# Patient Record
Sex: Female | Born: 1975 | Race: White | Hispanic: No | Marital: Married | Smoking: Current every day smoker
Health system: Southern US, Community
[De-identification: ages and names within clinical notes are randomized; demographics above are authoritative.]

## PROBLEM LIST (undated history)

## (undated) DIAGNOSIS — E282 Polycystic ovarian syndrome: Secondary | ICD-10-CM

## (undated) DIAGNOSIS — O24419 Gestational diabetes mellitus in pregnancy, unspecified control: Secondary | ICD-10-CM

## (undated) HISTORY — DX: Gestational diabetes mellitus in pregnancy, unspecified control: O24.419

## (undated) HISTORY — PX: HEMORROIDECTOMY: SUR656

---

## 2011-03-22 ENCOUNTER — Encounter: Payer: Self-pay | Admitting: Maternal and Fetal Medicine

## 2011-04-26 ENCOUNTER — Encounter: Payer: Self-pay | Admitting: Maternal & Fetal Medicine

## 2011-05-10 ENCOUNTER — Encounter: Payer: Self-pay | Admitting: Obstetrics and Gynecology

## 2011-06-14 ENCOUNTER — Ambulatory Visit: Payer: Self-pay | Admitting: Obstetrics and Gynecology

## 2011-06-24 ENCOUNTER — Encounter: Payer: Self-pay | Admitting: Obstetrics and Gynecology

## 2011-07-01 ENCOUNTER — Encounter: Payer: Self-pay | Admitting: Obstetrics & Gynecology

## 2011-07-12 ENCOUNTER — Encounter: Payer: Self-pay | Admitting: Maternal & Fetal Medicine

## 2011-07-13 ENCOUNTER — Encounter: Payer: Self-pay | Admitting: Maternal & Fetal Medicine

## 2011-07-13 ENCOUNTER — Ambulatory Visit: Payer: Self-pay | Admitting: Obstetrics and Gynecology

## 2011-07-13 HISTORY — PX: TUBAL LIGATION: SHX77

## 2011-07-26 ENCOUNTER — Encounter: Payer: Self-pay | Admitting: Maternal & Fetal Medicine

## 2011-07-26 LAB — URINALYSIS, COMPLETE
Bilirubin,UR: NEGATIVE
Glucose,UR: NEGATIVE mg/dL (ref 0–75)
Ketone: NEGATIVE
Leukocyte Esterase: NEGATIVE
Ph: 7 (ref 4.5–8.0)
RBC,UR: NONE SEEN /HPF (ref 0–5)
Squamous Epithelial: 1
WBC UR: NONE SEEN /HPF (ref 0–5)

## 2011-08-02 ENCOUNTER — Observation Stay: Payer: Self-pay | Admitting: Obstetrics and Gynecology

## 2011-08-05 ENCOUNTER — Observation Stay: Payer: Self-pay | Admitting: Obstetrics and Gynecology

## 2011-08-09 ENCOUNTER — Observation Stay: Payer: Self-pay | Admitting: Obstetrics and Gynecology

## 2011-08-09 ENCOUNTER — Encounter: Payer: Self-pay | Admitting: Obstetrics and Gynecology

## 2011-08-09 LAB — URINALYSIS, COMPLETE
Bilirubin,UR: NEGATIVE
Blood: NEGATIVE
Glucose,UR: NEGATIVE mg/dL (ref 0–75)
Leukocyte Esterase: NEGATIVE
Ph: 7 (ref 4.5–8.0)
Protein: NEGATIVE
Specific Gravity: 1.009 (ref 1.003–1.030)
Squamous Epithelial: <1

## 2011-08-12 ENCOUNTER — Observation Stay: Payer: Self-pay | Admitting: Obstetrics and Gynecology

## 2011-08-16 ENCOUNTER — Observation Stay: Payer: Self-pay | Admitting: Obstetrics and Gynecology

## 2011-08-19 ENCOUNTER — Encounter: Payer: Self-pay | Admitting: Maternal and Fetal Medicine

## 2011-08-19 ENCOUNTER — Observation Stay: Payer: Self-pay | Admitting: Obstetrics and Gynecology

## 2011-08-19 LAB — URINALYSIS, COMPLETE
Bilirubin,UR: NEGATIVE
Blood: NEGATIVE
Glucose,UR: 150 mg/dL (ref 0–75)
Ketone: NEGATIVE
Nitrite: NEGATIVE
Specific Gravity: 1.017 (ref 1.003–1.030)
Squamous Epithelial: 1
WBC UR: 1 /HPF (ref 0–5)

## 2011-08-19 LAB — HEMOGLOBIN A1C: Hemoglobin A1C: 6.2 % (ref 4.2–6.3)

## 2011-08-23 ENCOUNTER — Observation Stay: Payer: Self-pay | Admitting: Obstetrics and Gynecology

## 2011-08-23 ENCOUNTER — Encounter: Payer: Self-pay | Admitting: Obstetrics and Gynecology

## 2011-08-23 LAB — URINALYSIS, COMPLETE
Bilirubin,UR: NEGATIVE
Glucose,UR: NEGATIVE mg/dL (ref 0–75)
Leukocyte Esterase: NEGATIVE
Nitrite: NEGATIVE
RBC,UR: <1 /HPF (ref 0–5)
Squamous Epithelial: 3
WBC UR: 1 /HPF (ref 0–5)

## 2011-08-26 ENCOUNTER — Observation Stay: Payer: Self-pay | Admitting: Obstetrics and Gynecology

## 2011-08-31 ENCOUNTER — Observation Stay: Payer: Self-pay | Admitting: Obstetrics and Gynecology

## 2011-09-02 ENCOUNTER — Encounter: Payer: Self-pay | Admitting: Obstetrics and Gynecology

## 2011-09-02 ENCOUNTER — Observation Stay: Payer: Self-pay | Admitting: Obstetrics and Gynecology

## 2011-09-02 LAB — URINALYSIS, COMPLETE
Bilirubin,UR: NEGATIVE
Blood: NEGATIVE
Glucose,UR: 150 mg/dL (ref 0–75)
Leukocyte Esterase: NEGATIVE
Protein: NEGATIVE
Specific Gravity: 1.012 (ref 1.003–1.030)
Squamous Epithelial: 5
WBC UR: 1 /HPF (ref 0–5)

## 2011-09-06 ENCOUNTER — Observation Stay: Payer: Self-pay | Admitting: Obstetrics and Gynecology

## 2011-09-09 ENCOUNTER — Encounter: Payer: Self-pay | Admitting: Maternal & Fetal Medicine

## 2011-09-09 ENCOUNTER — Observation Stay: Payer: Self-pay | Admitting: Obstetrics and Gynecology

## 2011-09-09 LAB — URINALYSIS, COMPLETE
Glucose,UR: NEGATIVE mg/dL (ref 0–75)
Ketone: NEGATIVE
Ph: 7 (ref 4.5–8.0)
Protein: NEGATIVE
RBC,UR: 1 /HPF (ref 0–5)
Specific Gravity: 1.008 (ref 1.003–1.030)
WBC UR: 1 /HPF (ref 0–5)

## 2011-09-13 ENCOUNTER — Encounter: Payer: Self-pay | Admitting: Maternal and Fetal Medicine

## 2011-09-13 ENCOUNTER — Observation Stay: Payer: Self-pay | Admitting: Obstetrics and Gynecology

## 2011-09-13 LAB — URINALYSIS, COMPLETE
Bilirubin,UR: NEGATIVE
Blood: NEGATIVE
Ketone: NEGATIVE
Ph: 7 (ref 4.5–8.0)
Protein: NEGATIVE
RBC,UR: 1 /HPF (ref 0–5)
Specific Gravity: 1.014 (ref 1.003–1.030)
Squamous Epithelial: 9

## 2011-09-16 ENCOUNTER — Observation Stay: Payer: Self-pay | Admitting: Obstetrics and Gynecology

## 2011-09-16 ENCOUNTER — Encounter: Payer: Self-pay | Admitting: Maternal & Fetal Medicine

## 2011-09-16 LAB — URINALYSIS, COMPLETE
Blood: NEGATIVE
Glucose,UR: 50 mg/dL (ref 0–75)
Protein: NEGATIVE
RBC,UR: 1 /HPF (ref 0–5)
Specific Gravity: 1.023 (ref 1.003–1.030)
Squamous Epithelial: 9
WBC UR: 2 /HPF (ref 0–5)

## 2011-09-20 ENCOUNTER — Observation Stay: Payer: Self-pay | Admitting: Obstetrics and Gynecology

## 2011-09-23 ENCOUNTER — Encounter: Payer: Self-pay | Admitting: Obstetrics & Gynecology

## 2011-09-23 ENCOUNTER — Observation Stay: Payer: Self-pay | Admitting: Obstetrics and Gynecology

## 2011-09-23 LAB — URINALYSIS, COMPLETE
Bacteria: NONE SEEN
Glucose,UR: 150 mg/dL (ref 0–75)
Ph: 7 (ref 4.5–8.0)
RBC,UR: <1 /HPF (ref 0–5)
Specific Gravity: 1.009 (ref 1.003–1.030)
Squamous Epithelial: 5
WBC UR: <1 /HPF (ref 0–5)

## 2011-09-27 ENCOUNTER — Ambulatory Visit: Payer: Self-pay | Admitting: Obstetrics and Gynecology

## 2011-09-27 ENCOUNTER — Encounter: Payer: Self-pay | Admitting: Maternal & Fetal Medicine

## 2011-09-27 LAB — CBC WITH DIFFERENTIAL/PLATELET
Basophil #: 0 x10 3/mm 3
Basophil %: 0.3 %
Eosinophil #: 0.1 x10 3/mm 3
Eosinophil %: 1 %
HCT: 38 %
HGB: 12.9 g/dL
Lymphocyte %: 19.4 %
Lymphs Abs: 2.8 x10 3/mm 3
MCH: 30.2 pg
MCHC: 33.8 g/dL
MCV: 89 fL
Monocyte #: 0.9 x10 3/mm 3 — ABNORMAL HIGH
Monocyte %: 6.4 %
Neutrophil #: 10.4 x10 3/mm 3 — ABNORMAL HIGH
Neutrophil %: 72.9 %
Platelet: 261 x10 3/mm 3
RBC: 4.26 X10 6/mm 3
RDW: 13.2 %
WBC: 14.2 x10 3/mm 3 — ABNORMAL HIGH

## 2011-09-27 LAB — URINALYSIS, COMPLETE
Blood: NEGATIVE
Glucose,UR: 50 mg/dL (ref 0–75)
Ketone: NEGATIVE
Leukocyte Esterase: NEGATIVE
Ph: 6 (ref 4.5–8.0)
Protein: NEGATIVE
Squamous Epithelial: 6

## 2011-09-28 ENCOUNTER — Inpatient Hospital Stay: Payer: Self-pay | Admitting: Obstetrics and Gynecology

## 2011-09-29 LAB — HEMATOCRIT: HCT: 32.3 % — ABNORMAL LOW (ref 35.0–47.0)

## 2012-06-01 ENCOUNTER — Ambulatory Visit: Payer: Self-pay | Admitting: Internal Medicine

## 2012-07-12 HISTORY — PX: HEMORROIDECTOMY: SUR656

## 2013-02-24 IMAGING — US US OB FOLLOW-UP - NRPT [facility]
1 series · 14 of 28 positions shown · non-contrast
Comparison: none

[Series 1: us ob follow-up - nrpt [facility] · 14 of 62 slices shown]
[im 3/62]
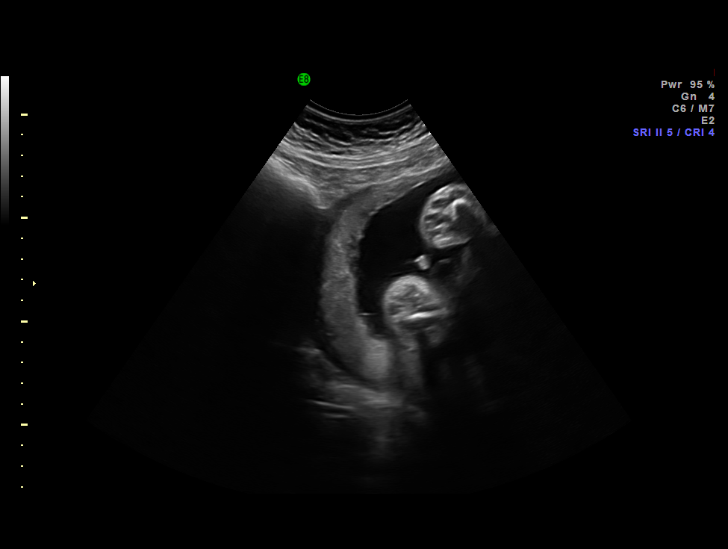
[im 7/62]
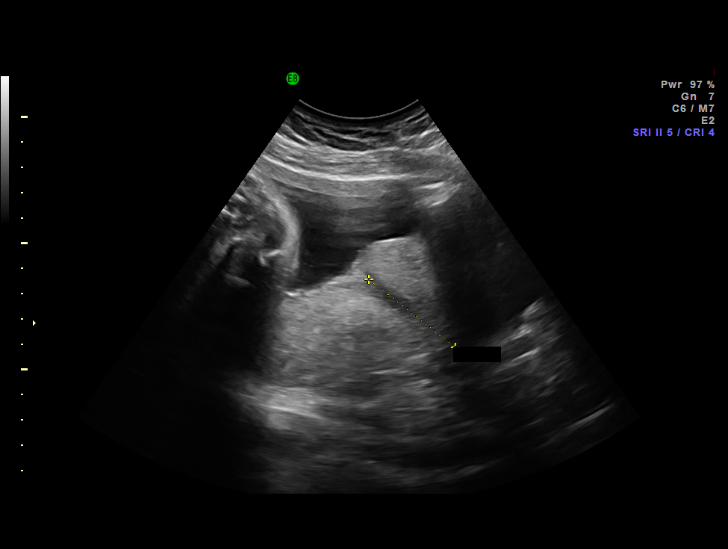
[im 12/62]
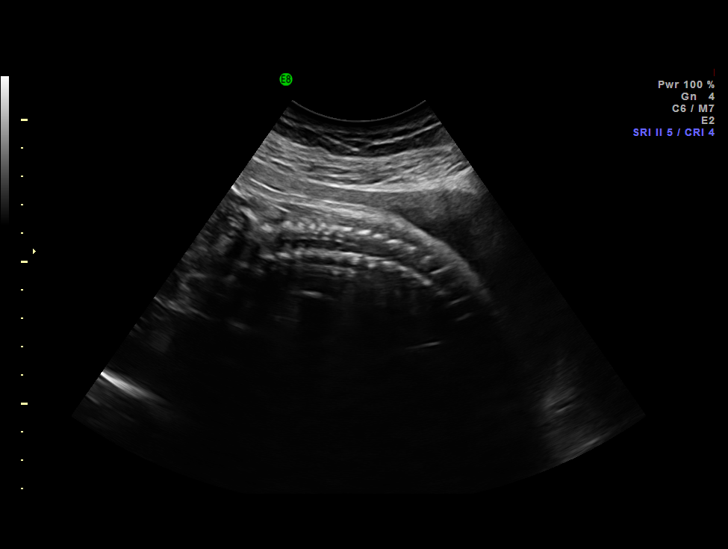
[im 16/62]
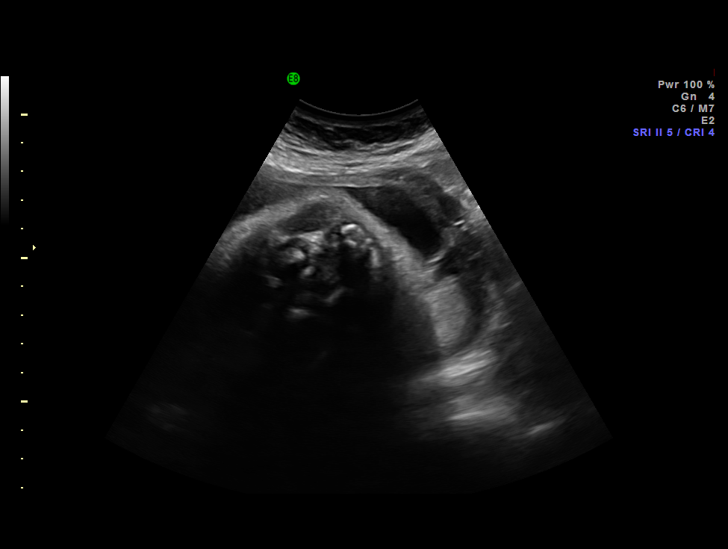
[im 21/62]
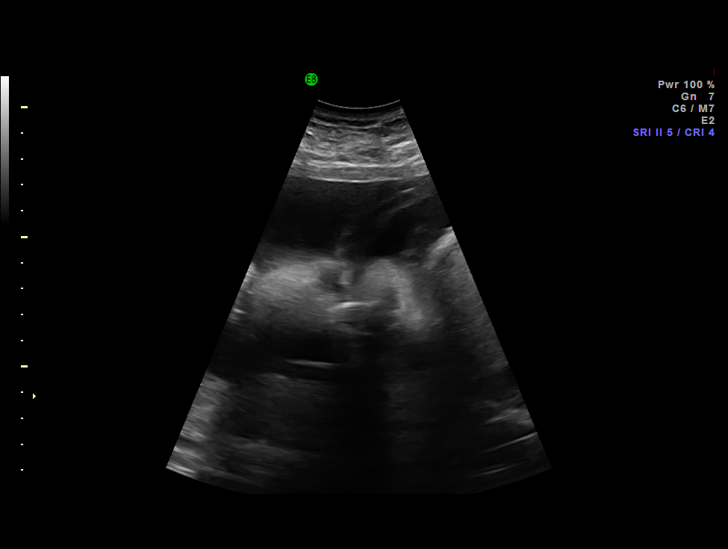
[im 25/62]
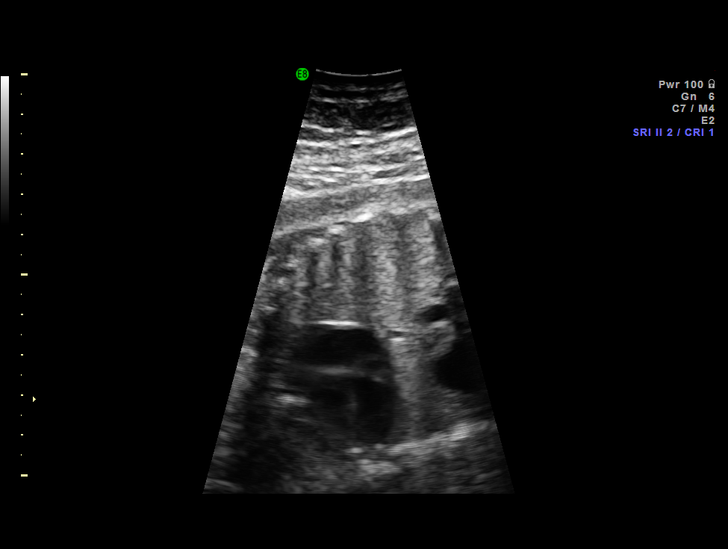
[im 30/62]
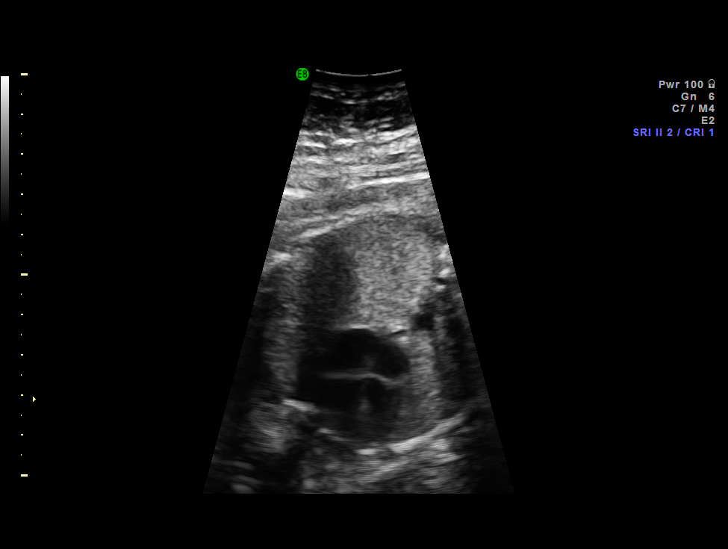
[im 34/62]
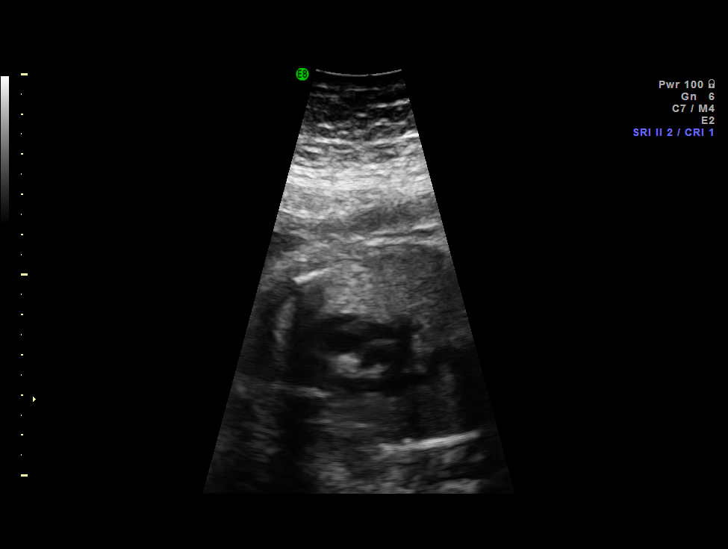
[im 39/62]
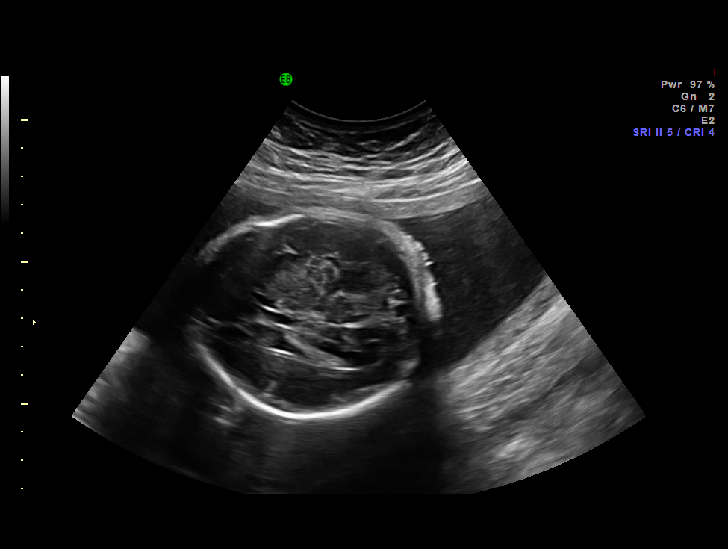
[im 43/62]
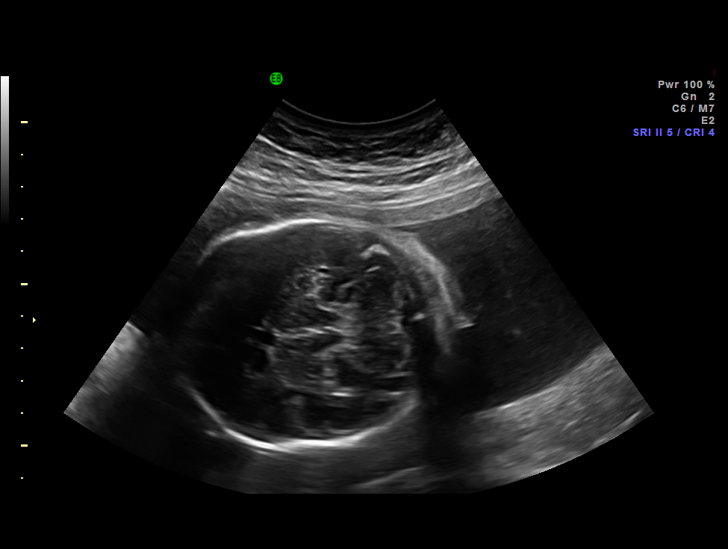
[im 48/62]
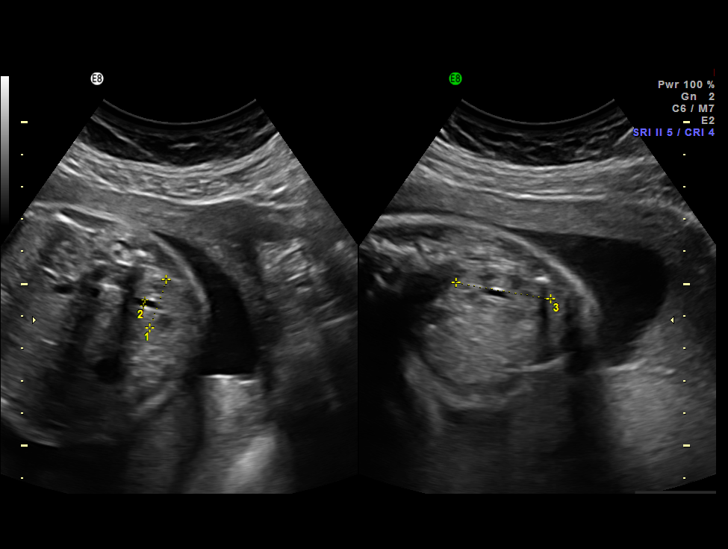
[im 52/62]
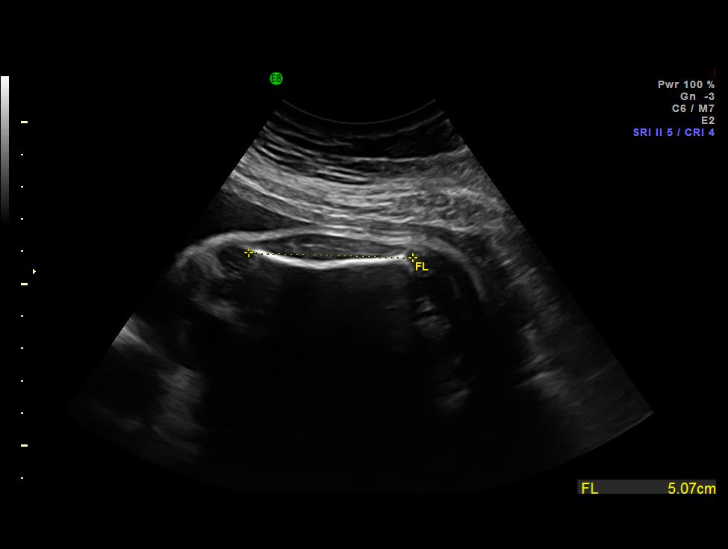
[im 57/62]
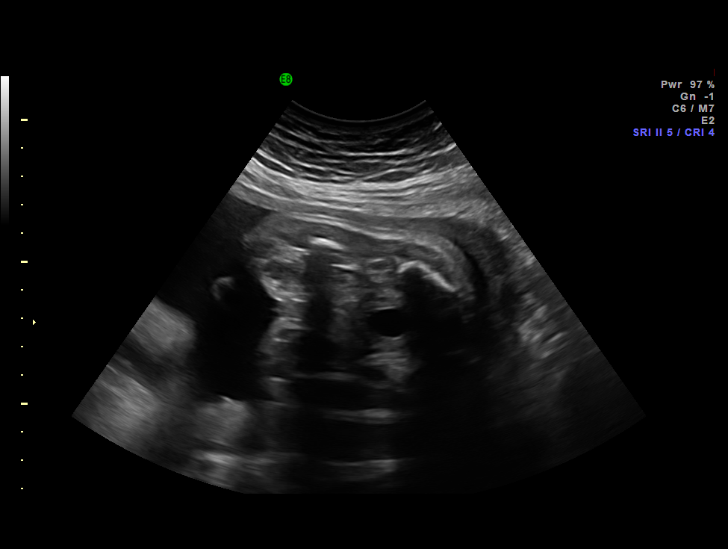
[im 62/62]
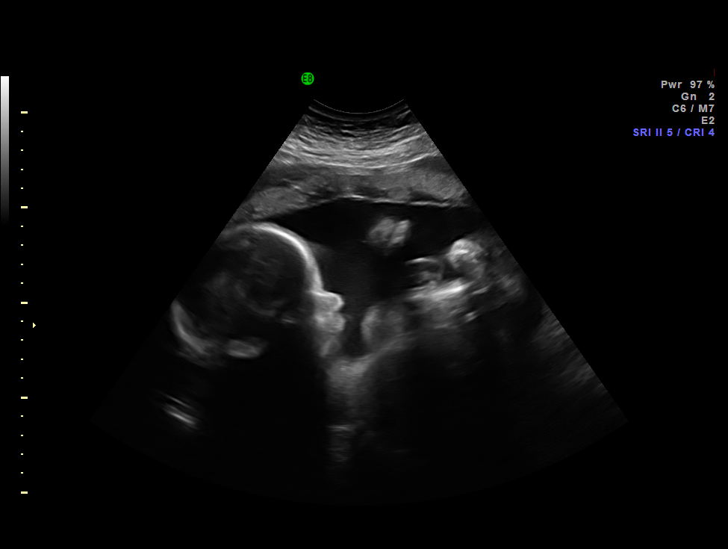

[14 of 28 positions shown; findings below may reference images not displayed]

IMAGES IMPORTED FROM THE SYNGO WORKFLOW SYSTEM
NO DICTATION FOR STUDY

## 2013-03-24 IMAGING — US US OB FOLLOW-UP - NRPT [facility]
1 series · 14 of 28 positions shown · non-contrast
Comparison: none

[Series 1: us ob follow-up - nrpt [facility] · 14 of 53 slices shown]
[im 2/53]
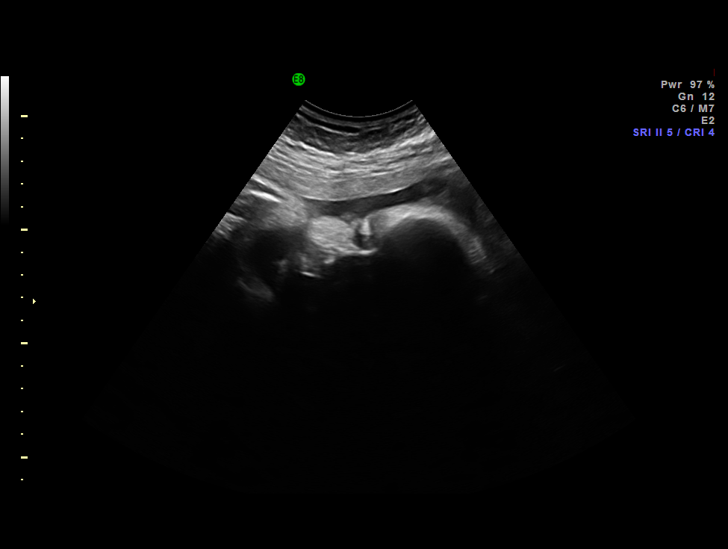
[im 6/53]
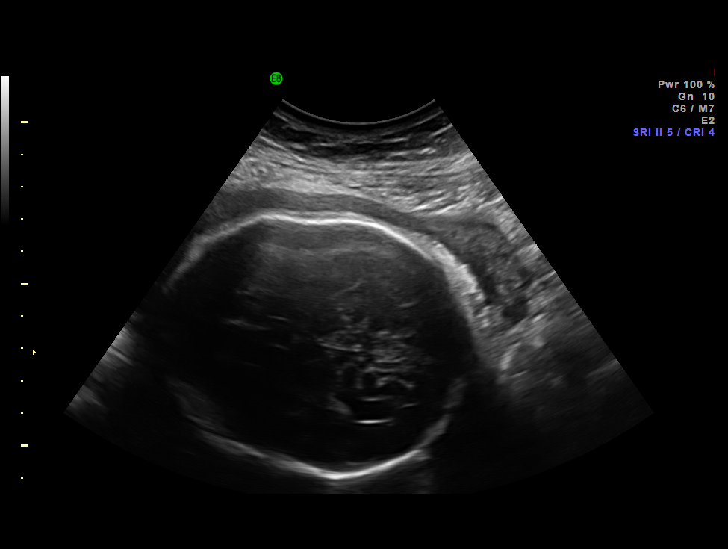
[im 10/53]
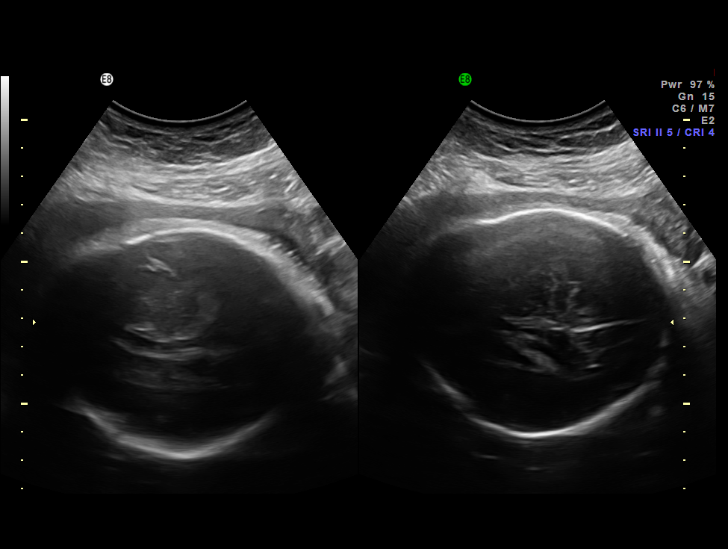
[im 14/53]
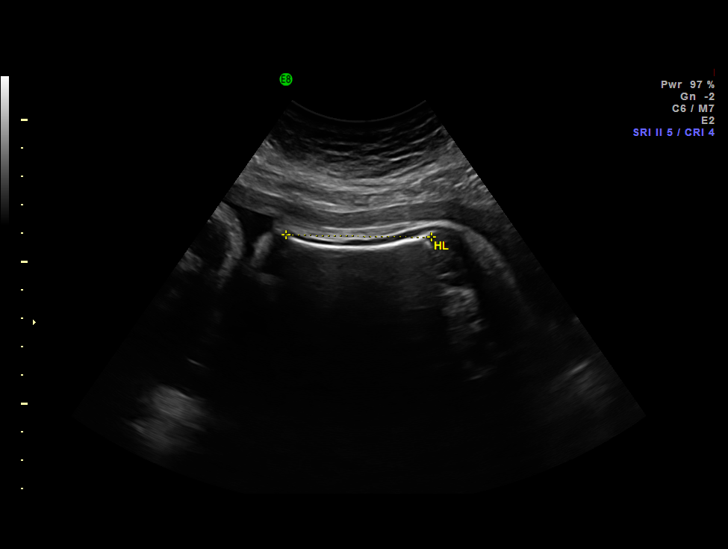
[im 18/53]
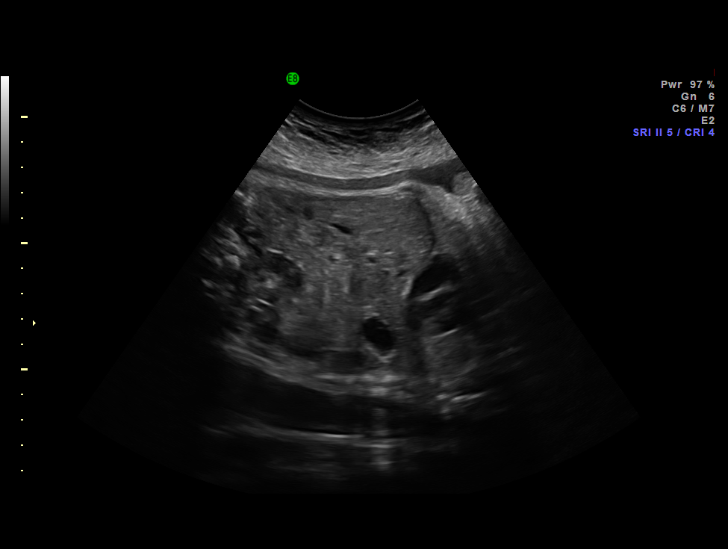
[im 22/53]
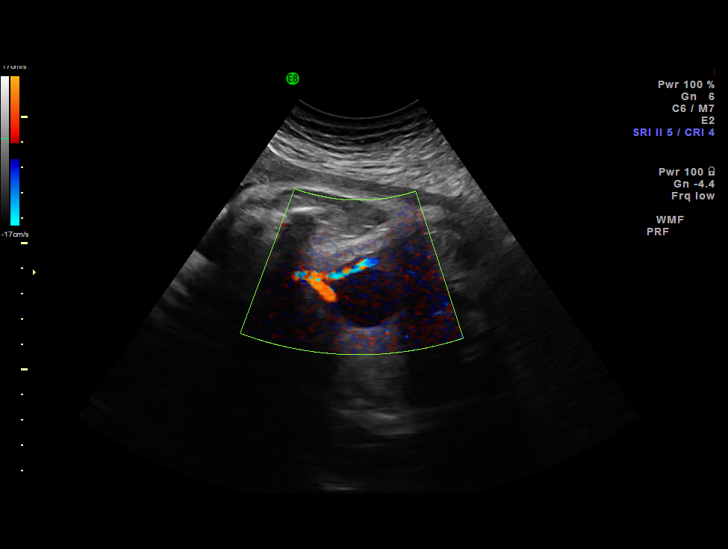
[im 26/53]
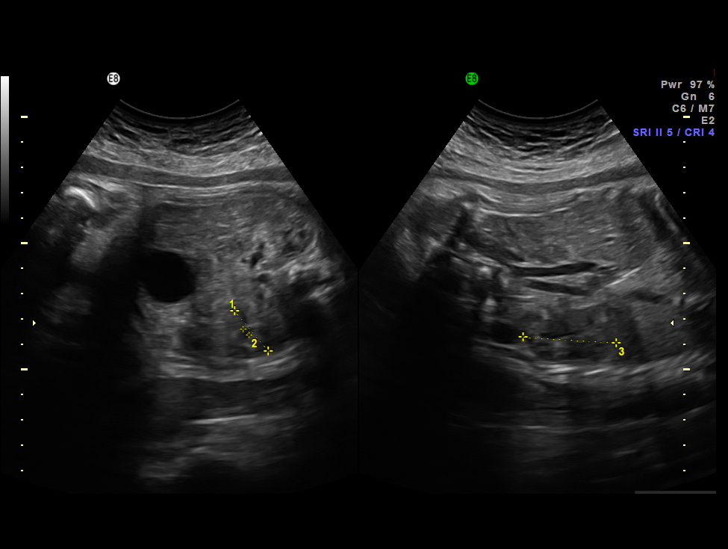
[im 29/53]
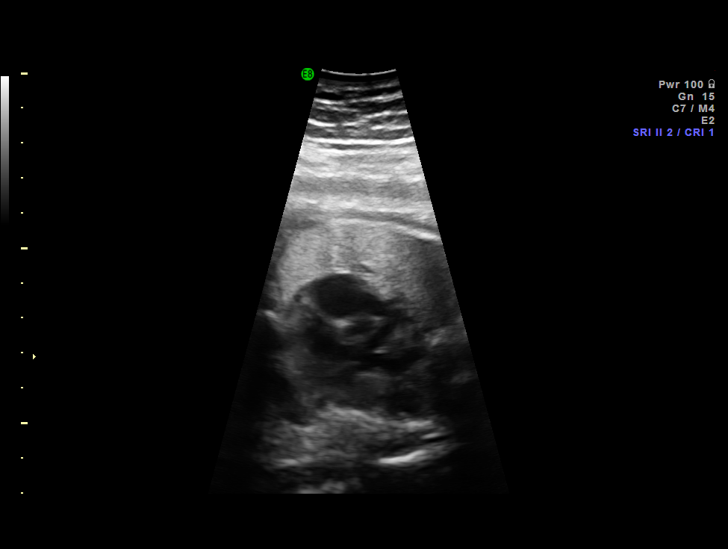
[im 33/53]
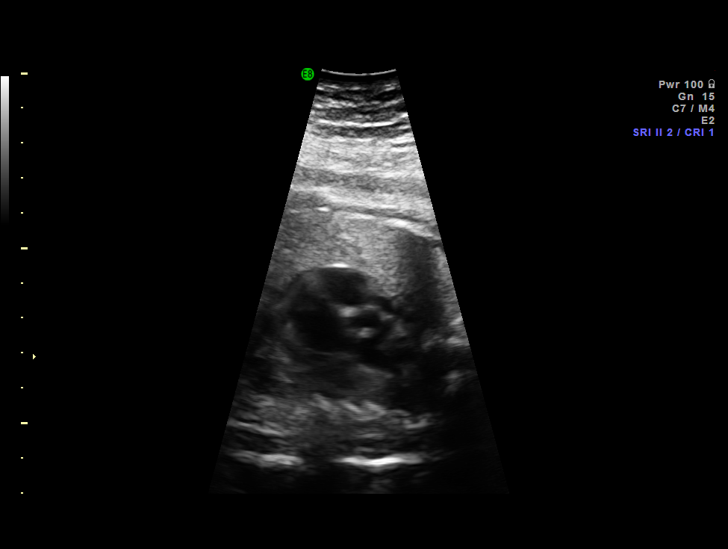
[im 37/53]
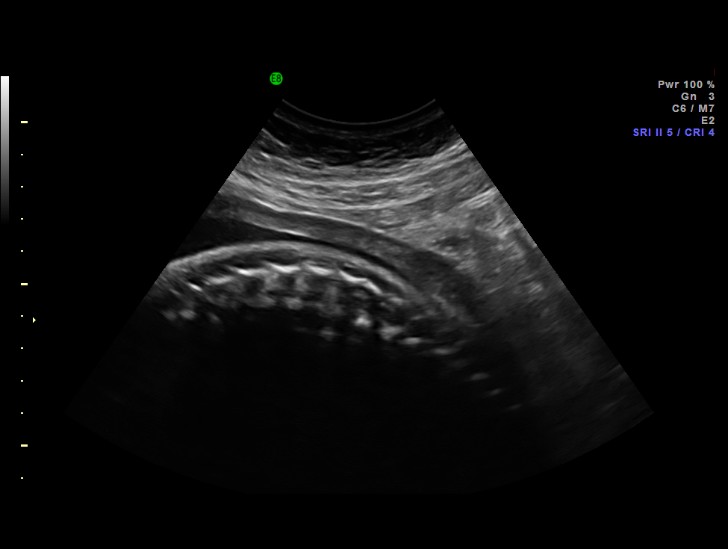
[im 41/53]
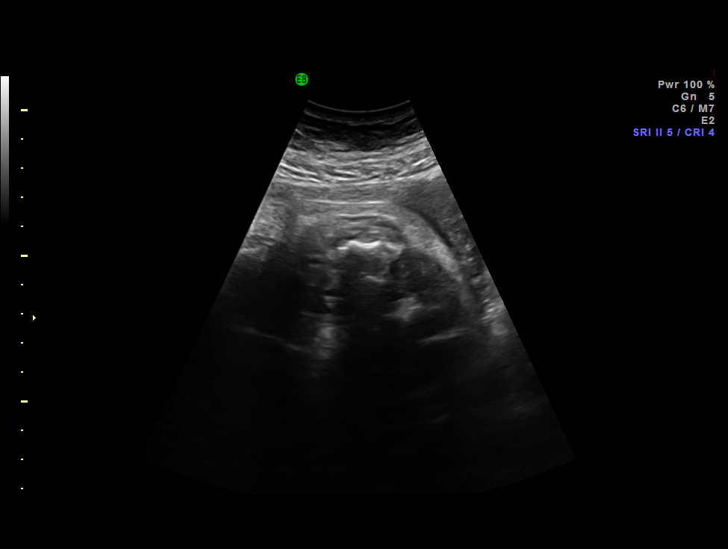
[im 45/53]
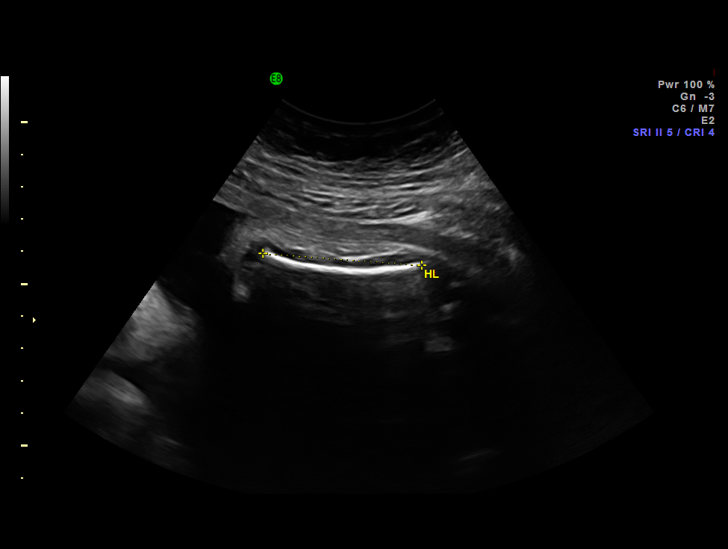
[im 49/53]
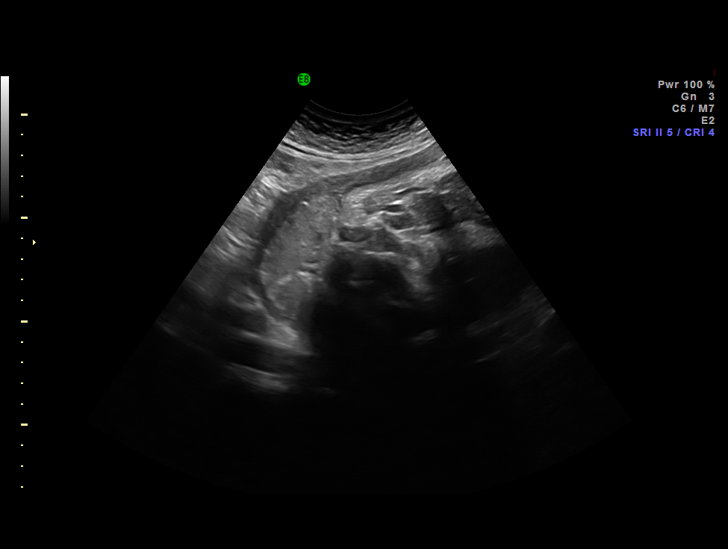
[im 53/53]
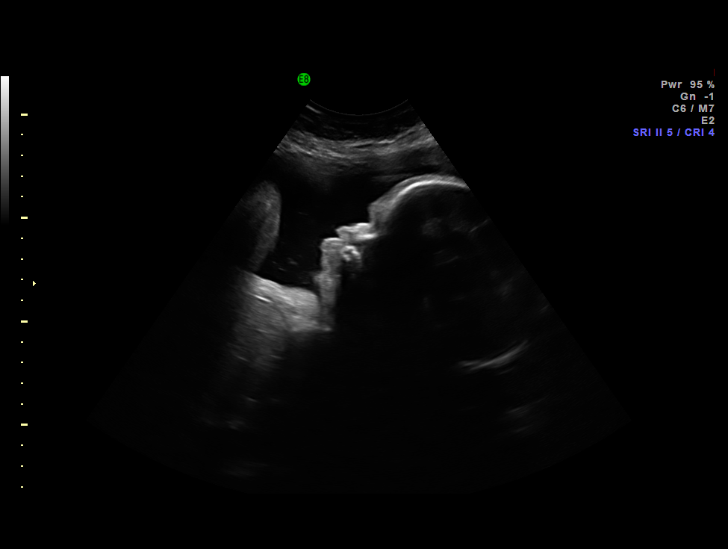

[14 of 28 positions shown; findings below may reference images not displayed]

IMAGES IMPORTED FROM THE SYNGO WORKFLOW SYSTEM
NO DICTATION FOR STUDY

## 2013-04-17 IMAGING — US US FETAL BPP W/O NON-STRESS - NRPT
1 series · 8 of 8 positions shown · non-contrast
Comparison: none

[Series 1: us fetal bpp w/o non-stress - nrpt · 8 of 8 slices shown]
[im 1/8]
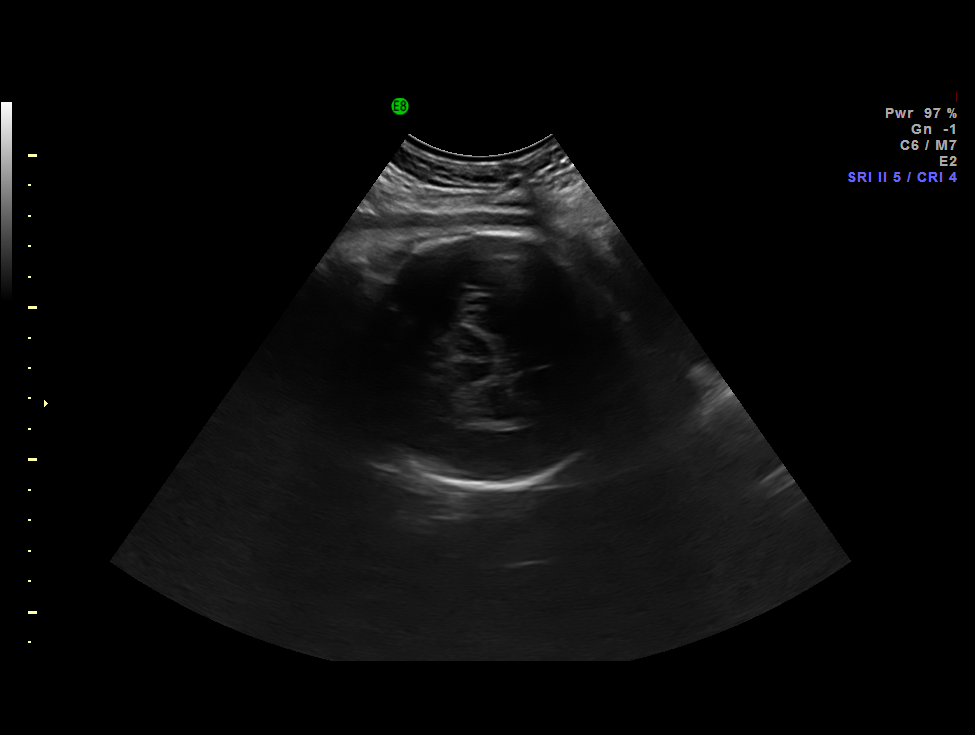
[im 2/8]
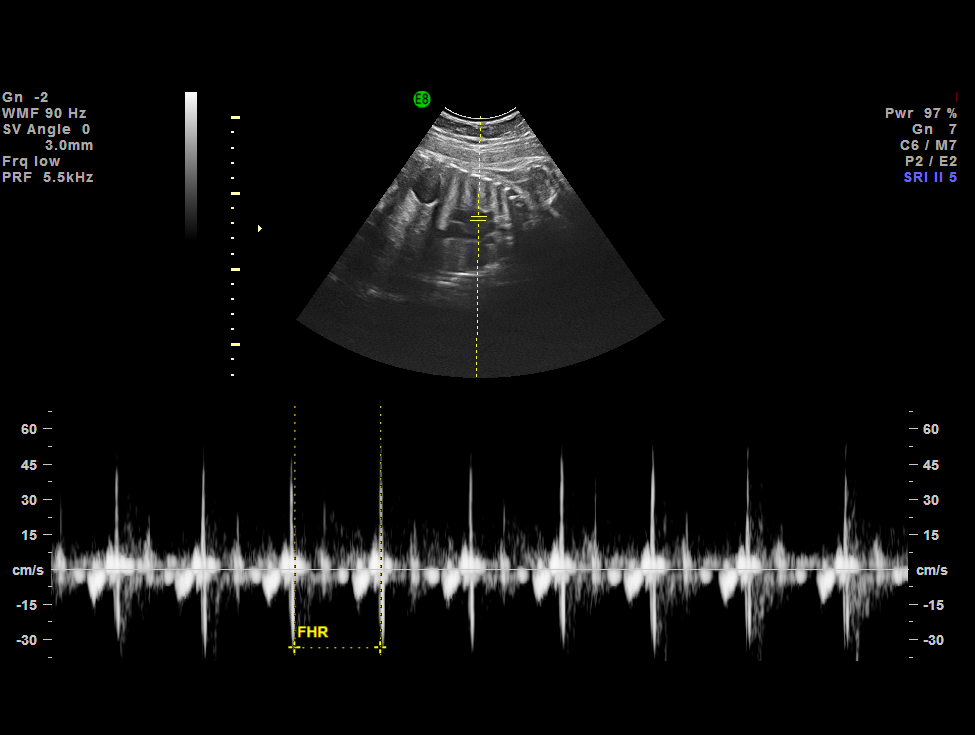
[im 3/8]
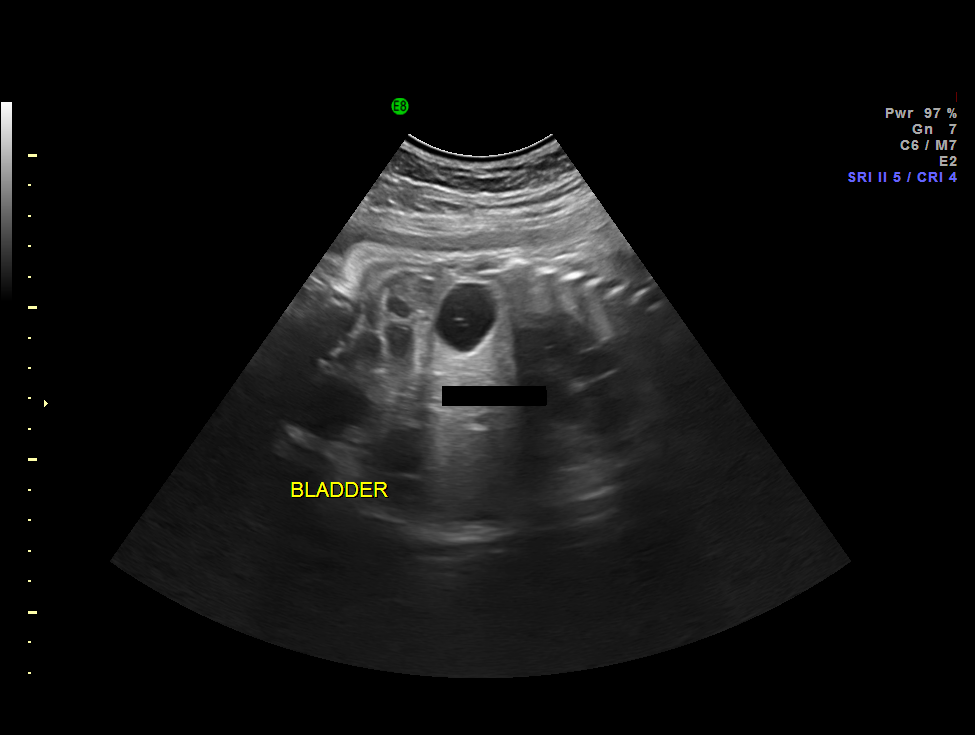
[im 4/8]
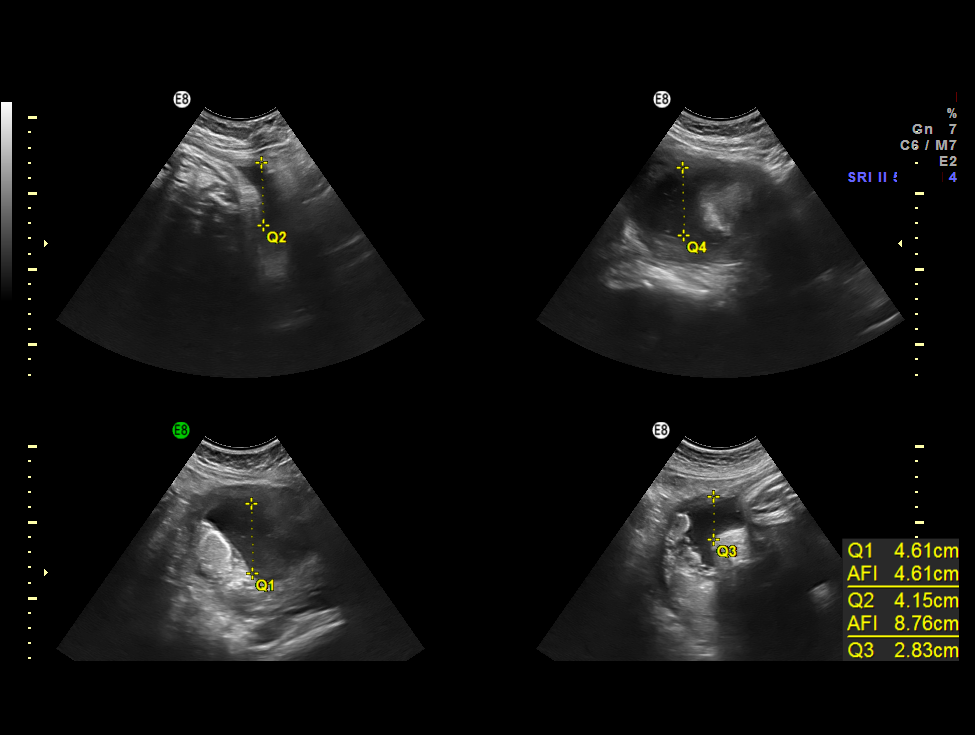
[im 5/8]
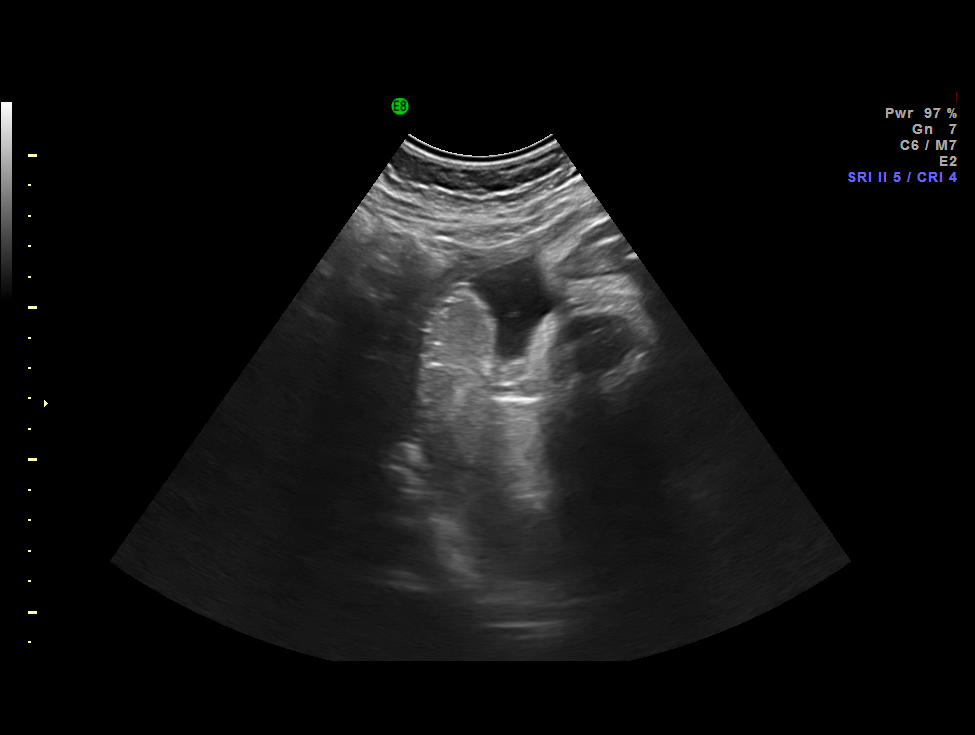
[im 6/8]
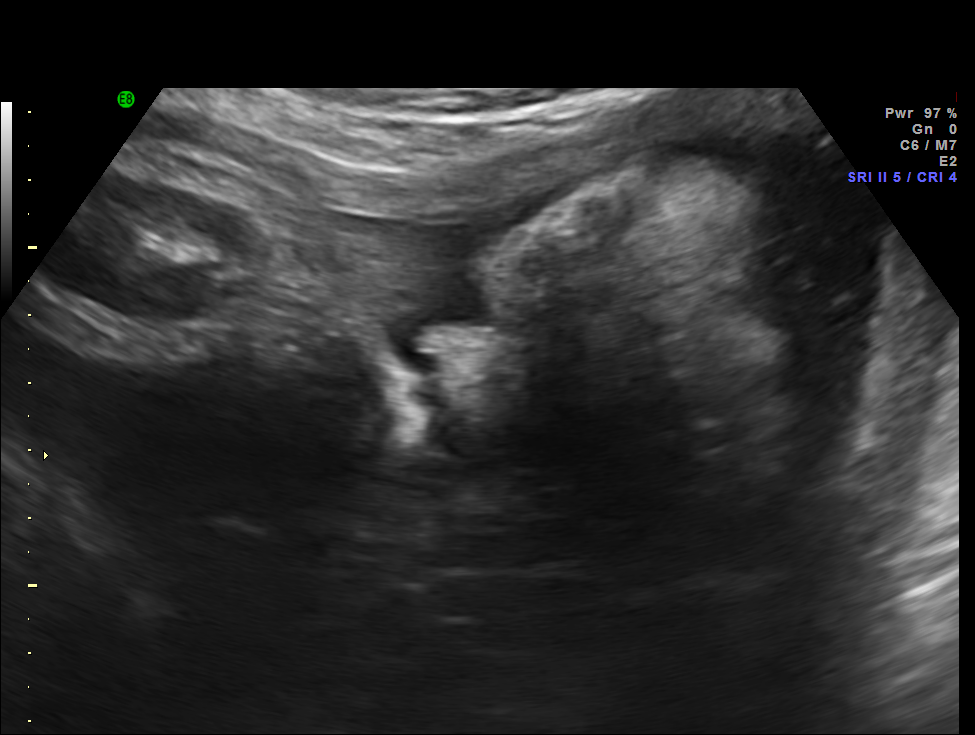
[im 7/8]
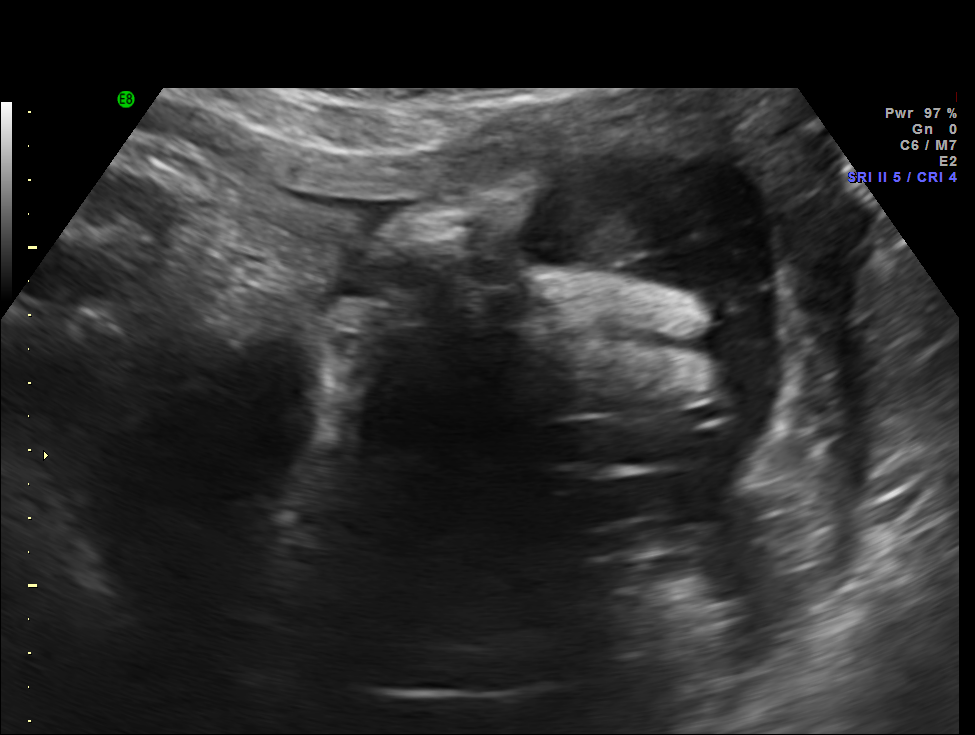
[im 8/8]
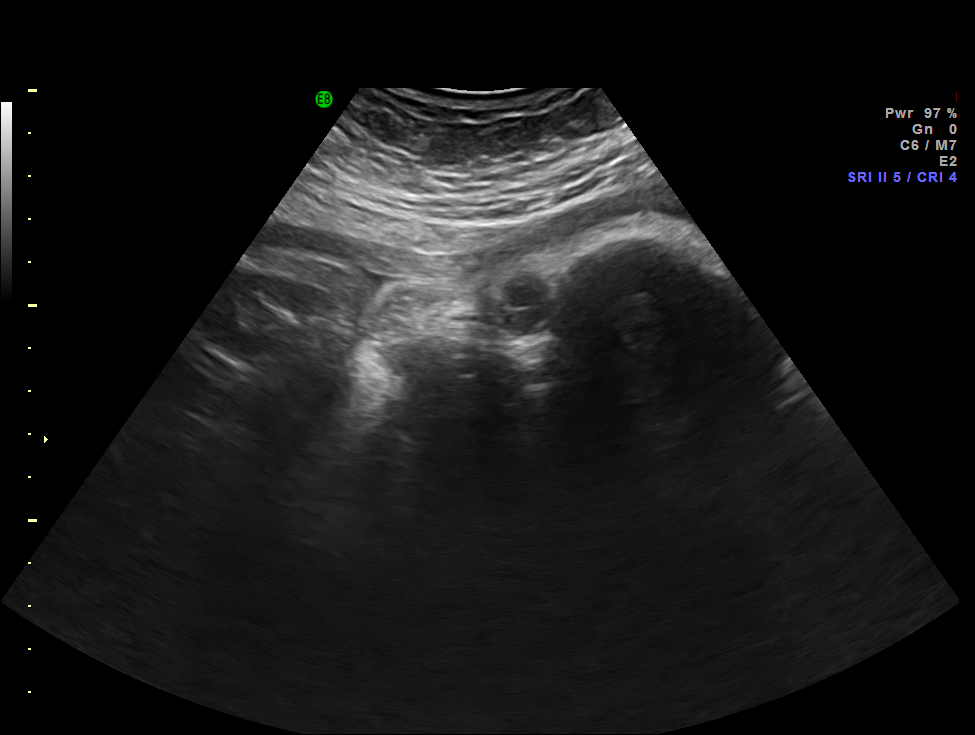

[8 of 8 positions shown; findings below may reference images not displayed]

IMAGES IMPORTED FROM THE SYNGO WORKFLOW SYSTEM
NO DICTATION FOR STUDY

## 2013-04-24 IMAGING — US US OB FOLLOW-UP - NRPT [facility]
1 series · 14 of 28 positions shown · non-contrast
Comparison: none

[Series 1: us ob follow-up - nrpt [facility] · 14 of 57 slices shown]
[im 3/57]
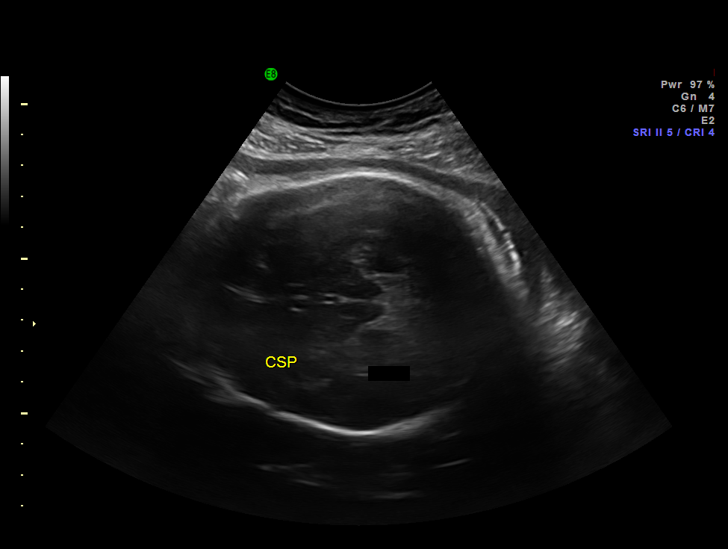
[im 7/57]
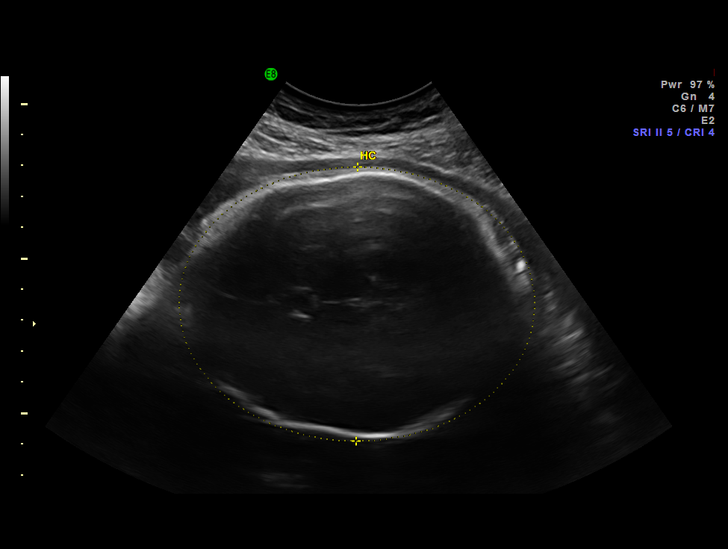
[im 11/57]
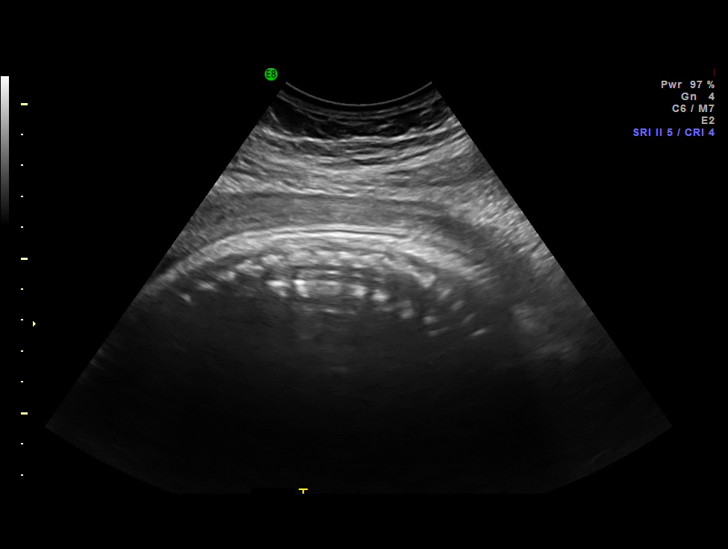
[im 15/57]
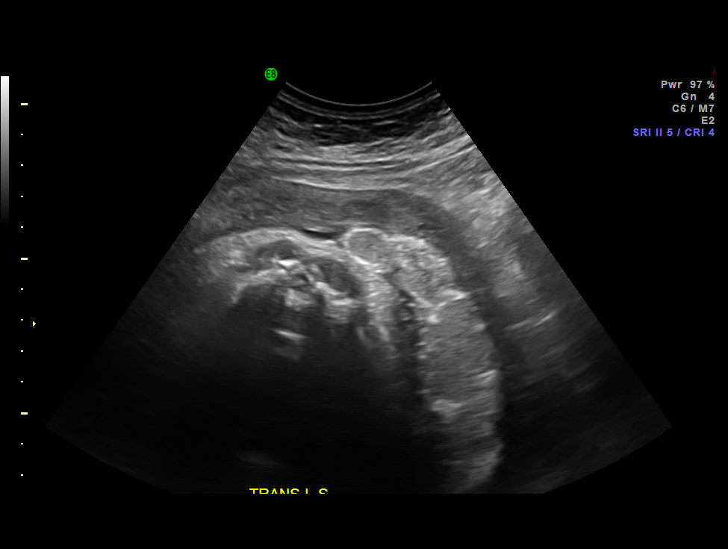
[im 19/57]
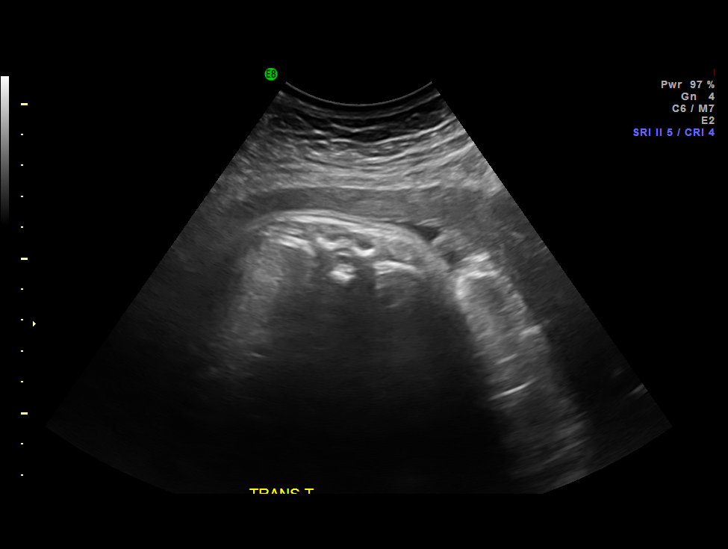
[im 23/57]
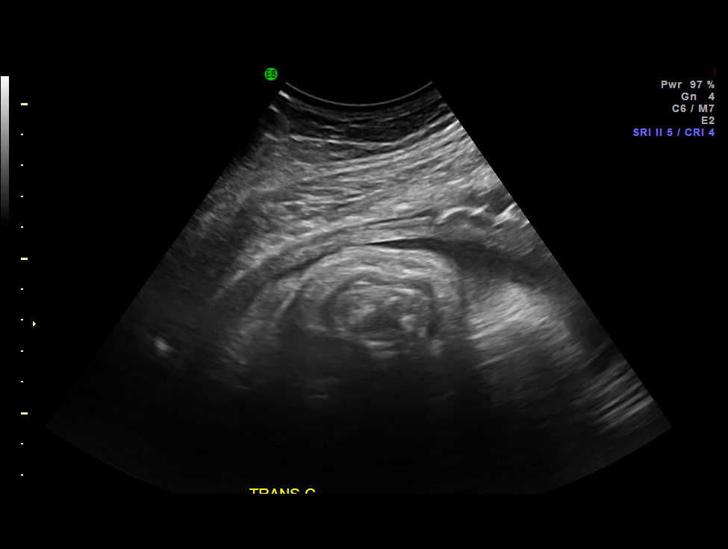
[im 27/57]
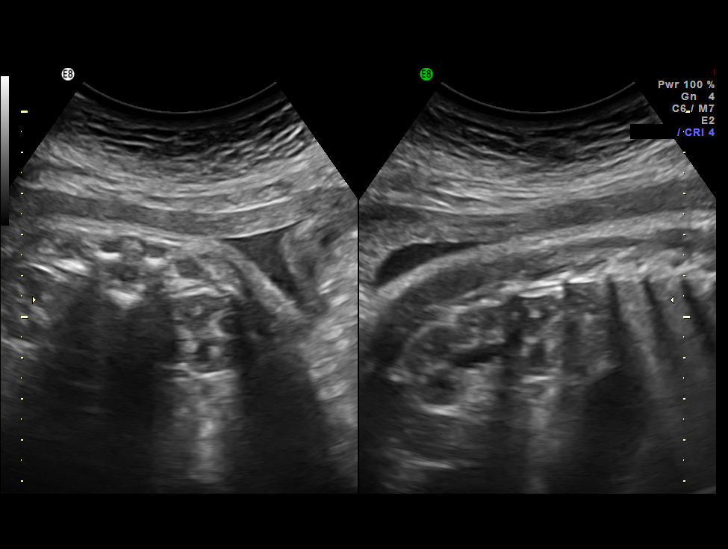
[im 32/57]
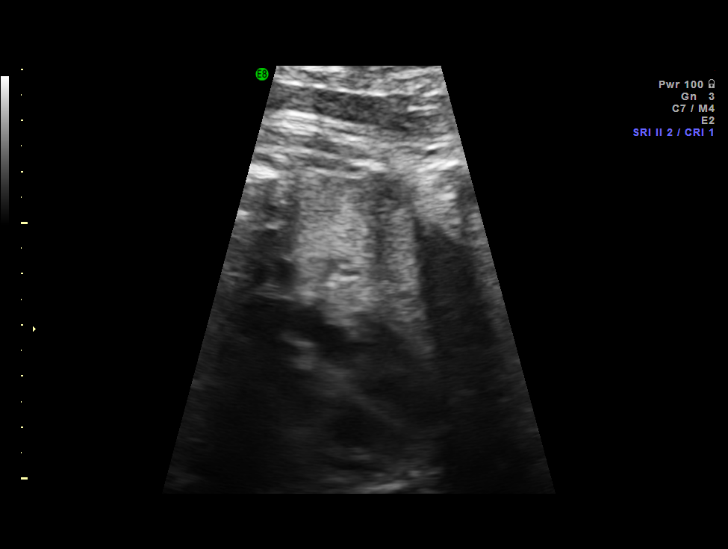
[im 36/57]
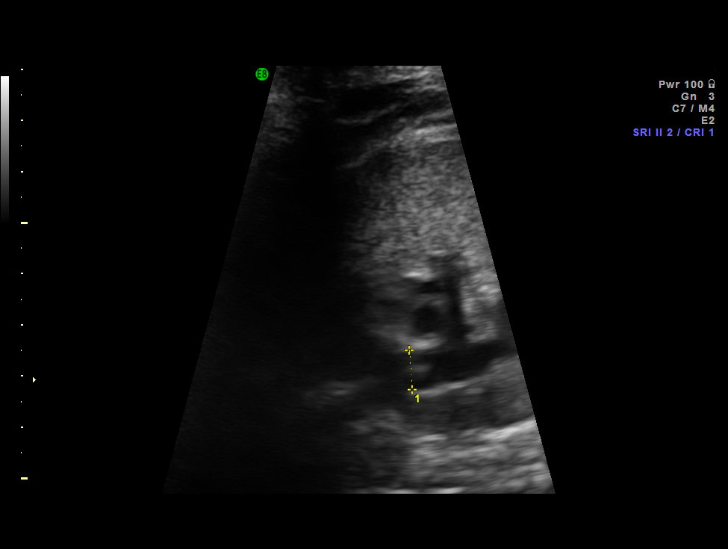
[im 40/57]
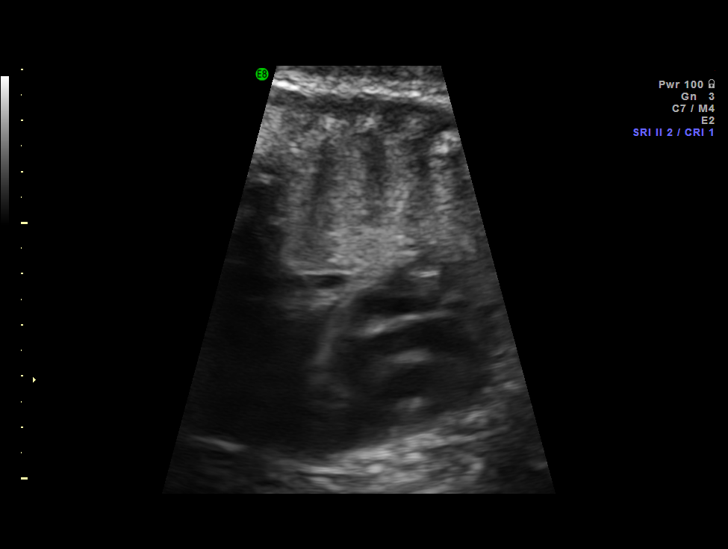
[im 44/57]
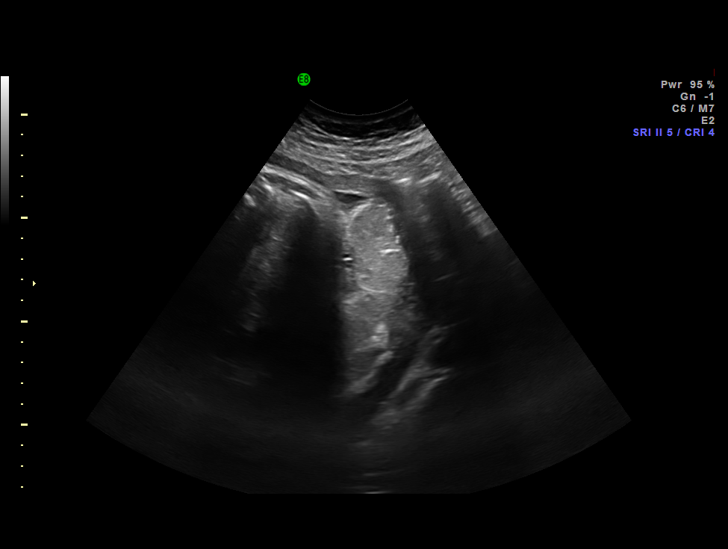
[im 48/57]
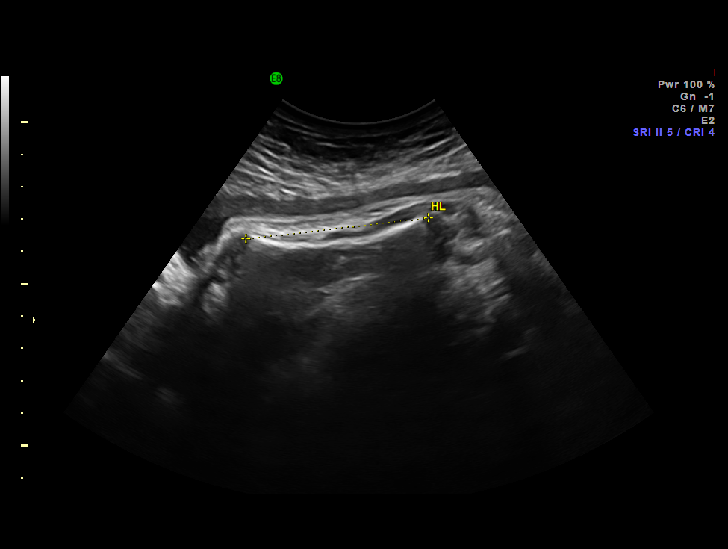
[im 52/57]
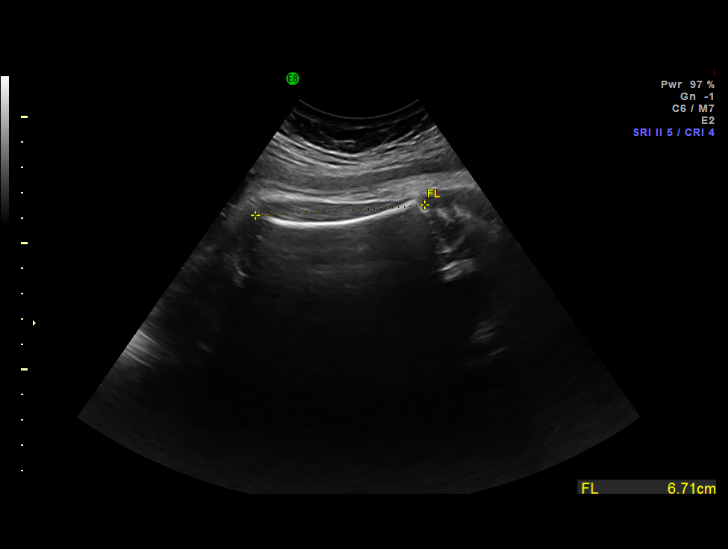
[im 57/57]
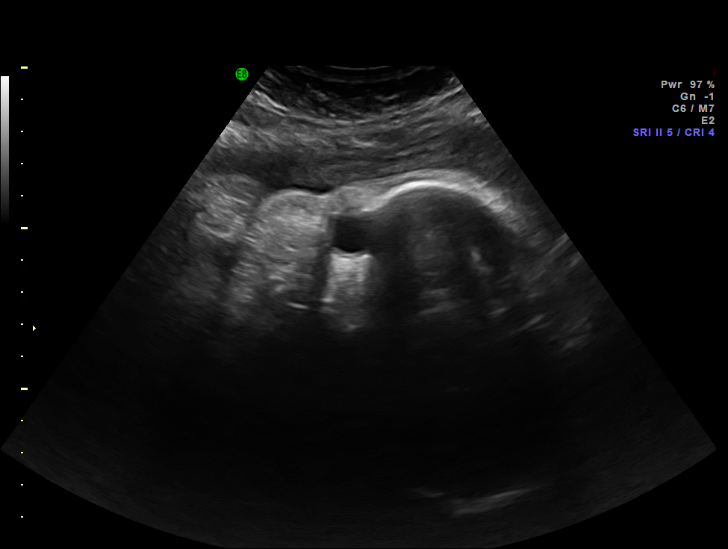

[14 of 28 positions shown; findings below may reference images not displayed]

IMAGES IMPORTED FROM THE SYNGO WORKFLOW SYSTEM
NO DICTATION FOR STUDY

## 2013-05-01 IMAGING — US US FETAL BPP W/O NON-STRESS - NRPT
1 series · 14 of 15 positions shown · non-contrast
Comparison: none

[Series 1: us fetal bpp w/o non-stress - nrpt · 14 of 15 slices shown]
[im 1/15]
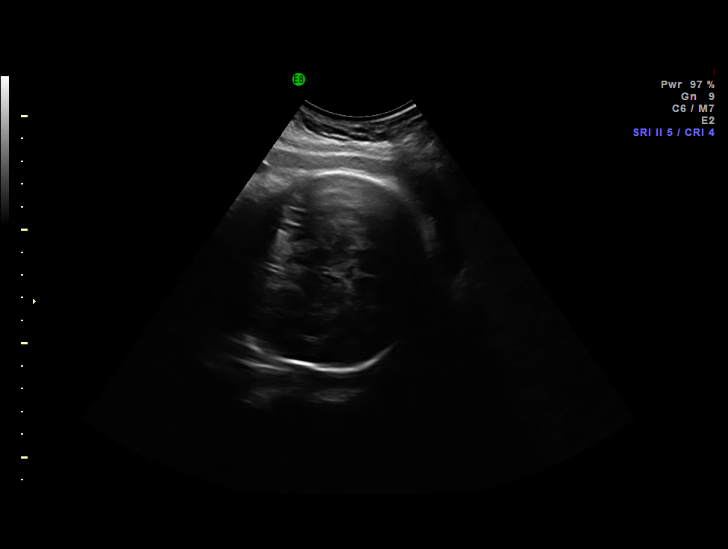
[im 2/15]
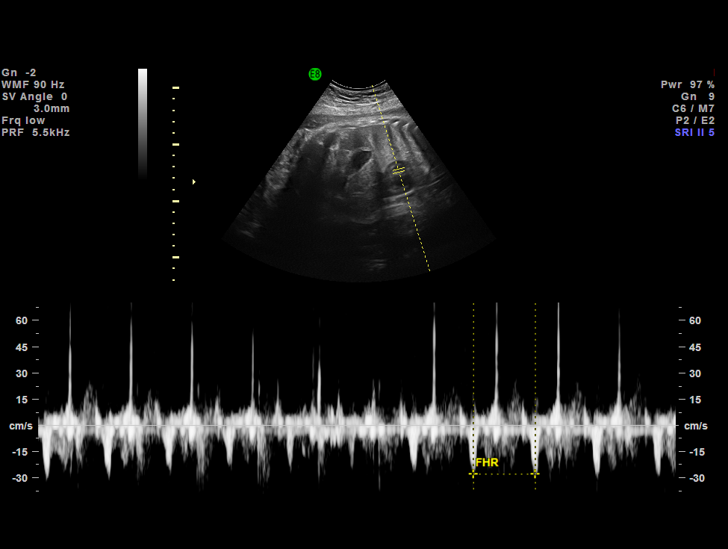
[im 3/15]
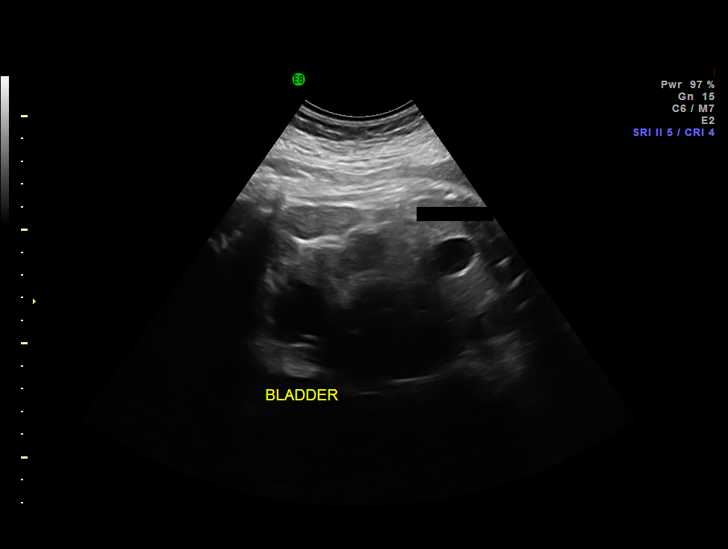
[im 4/15]
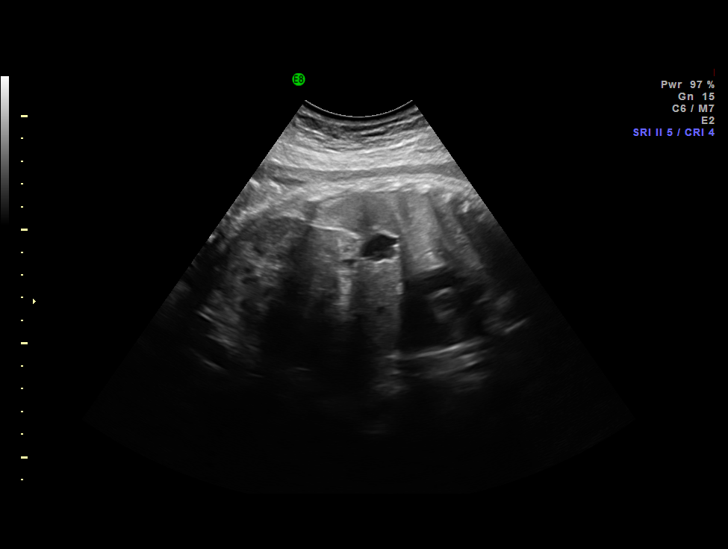
[im 5/15]
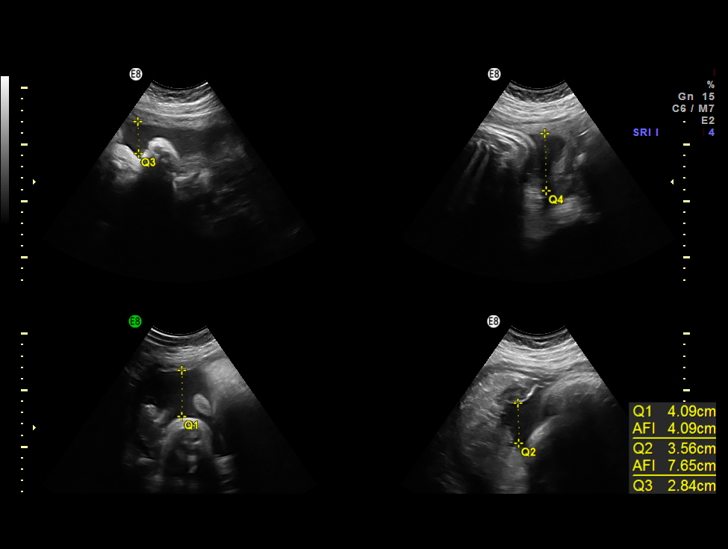
[im 6/15]
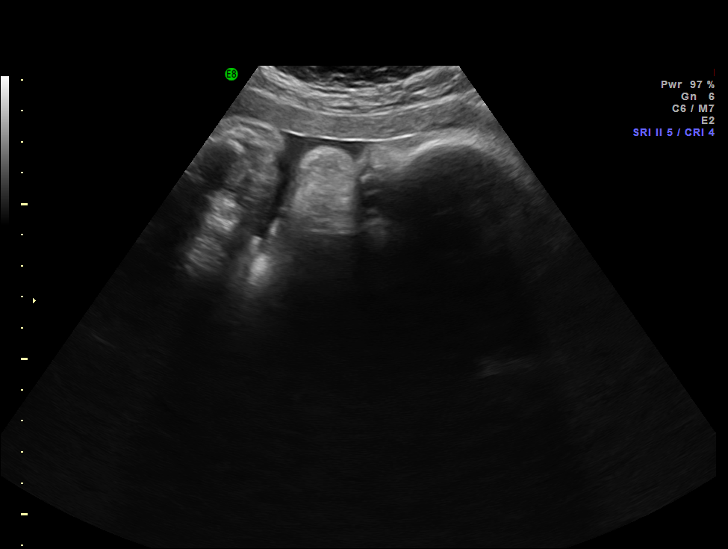
[im 7/15]
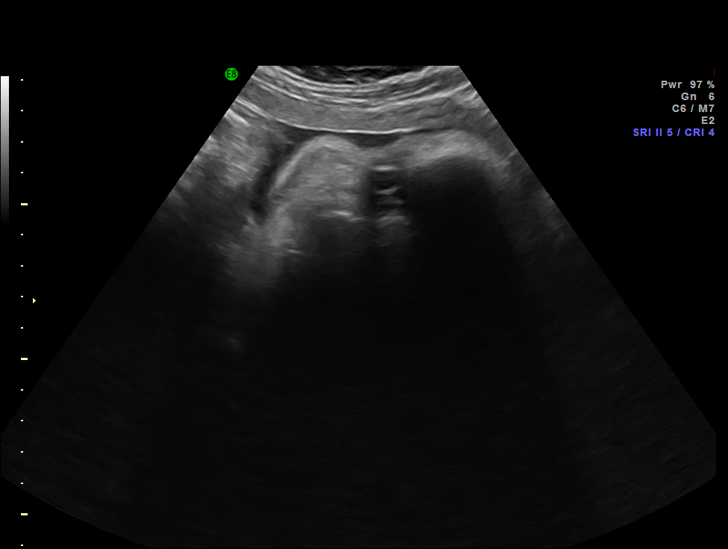
[im 9/15]
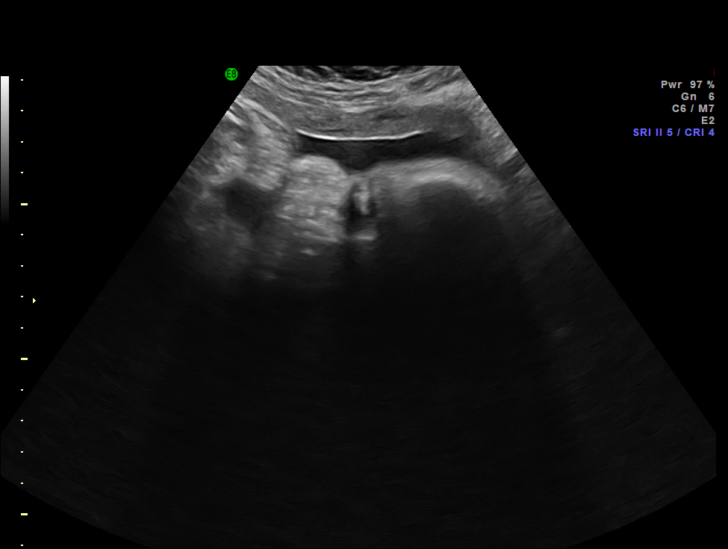
[im 10/15]
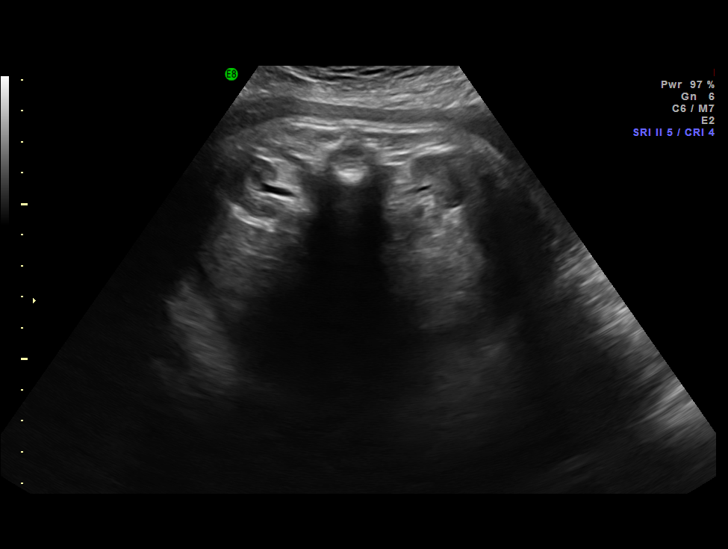
[im 11/15]
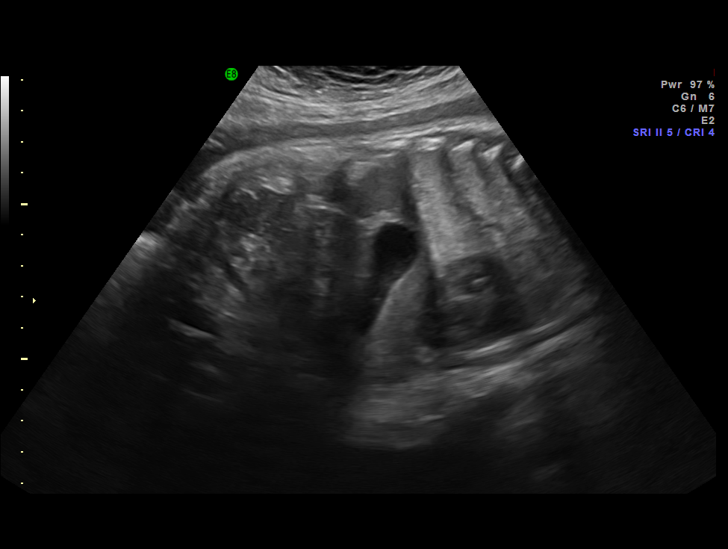
[im 12/15]
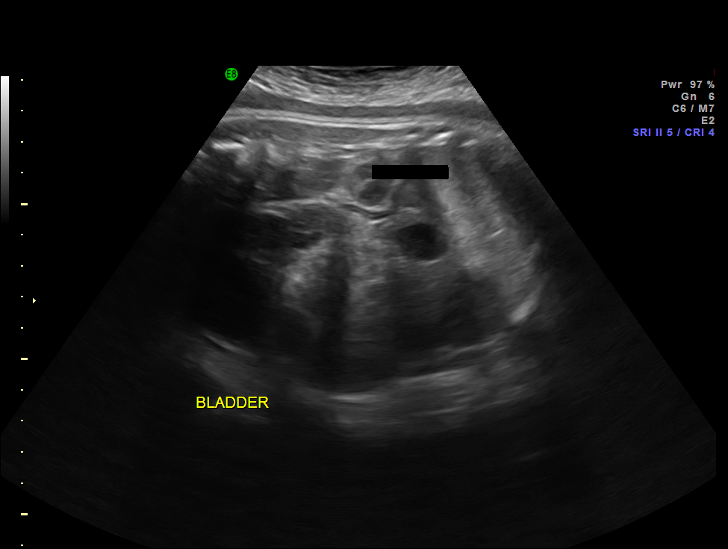
[im 13/15]
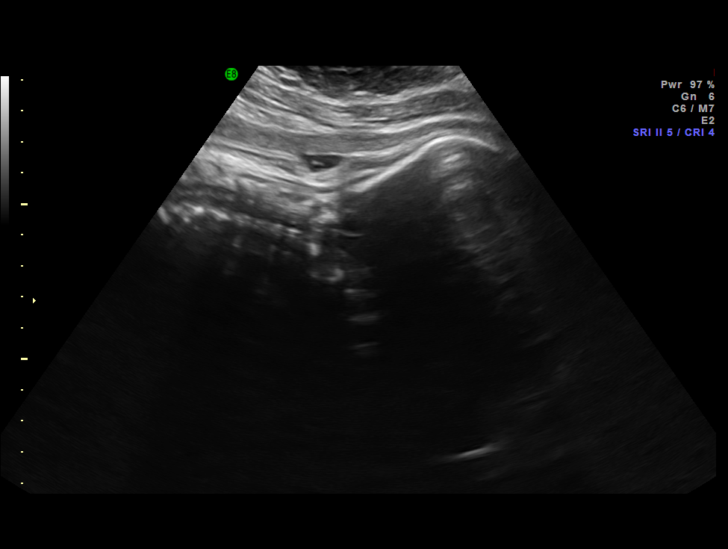
[im 14/15]
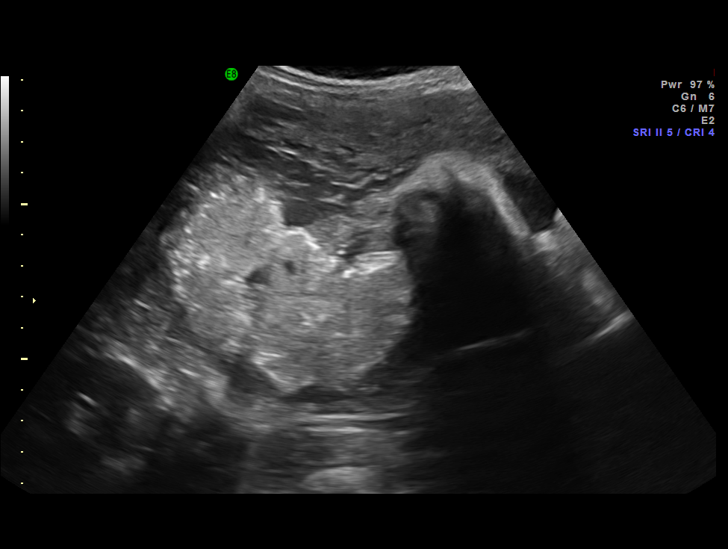
[im 15/15]
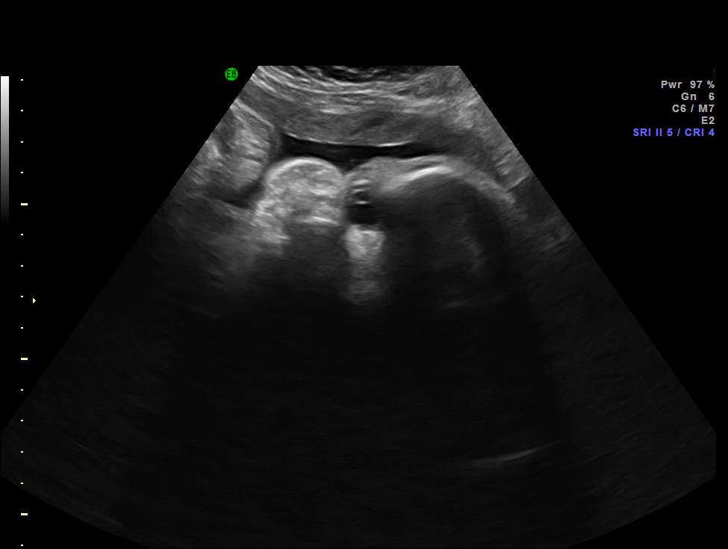

[14 of 15 positions shown; findings below may reference images not displayed]

IMAGES IMPORTED FROM THE SYNGO WORKFLOW SYSTEM
NO DICTATION FOR STUDY

## 2013-06-03 ENCOUNTER — Ambulatory Visit: Payer: Self-pay | Admitting: Emergency Medicine

## 2013-06-21 ENCOUNTER — Ambulatory Visit: Payer: Self-pay | Admitting: Surgery

## 2013-06-21 LAB — CBC WITH DIFFERENTIAL/PLATELET
Eosinophil %: 1.1 %
HCT: 42.6 % (ref 35.0–47.0)
Lymphocyte #: 2.8 10*3/uL (ref 1.0–3.6)
MCH: 29.4 pg (ref 26.0–34.0)
MCHC: 33.4 g/dL (ref 32.0–36.0)
MCV: 88 fL (ref 80–100)
Monocyte #: 1 x10 3/mm — ABNORMAL HIGH (ref 0.2–0.9)
Monocyte %: 7.8 %
WBC: 12.6 10*3/uL — ABNORMAL HIGH (ref 3.6–11.0)

## 2013-06-21 LAB — BASIC METABOLIC PANEL
Anion Gap: 5 — ABNORMAL LOW (ref 7–16)
BUN: 10 mg/dL (ref 7–18)
Calcium, Total: 9.1 mg/dL (ref 8.5–10.1)
Chloride: 105 mmol/L (ref 98–107)
Glucose: 118 mg/dL — ABNORMAL HIGH (ref 65–99)

## 2013-06-27 ENCOUNTER — Ambulatory Visit: Payer: Self-pay | Admitting: Surgery

## 2014-11-01 NOTE — Op Note (Signed)
PATIENT NAME:  Audrey Berry, Audrey Berry MR#:  [id number] DATE OF BIRTH:  03-28-1976  DATE OF PROCEDURE:  06/27/2013  PREOPERATIVE DIAGNOSIS:  Symptomatic external hemorrhoids.  POSTOPERATIVE DIAGNOSIS:  Symptomatic external hemorrhoids.  PROCEDURE PERFORMED: Examination under anesthesia external hemorrhoidectomy x 2.   SURGEON: Natale LayMark Dilynn Munroe, M.D.   ASSISTANT: ST  ANESTHESIA: General endotracheal and local.  SPECIMENS: As described above. Labeled separately anterior midline and posterior midline hemorrhoid tissue.   DESCRIPTION OF PROCEDURE: With informed consent, supine position, general anesthesia was induced.  Patient had a difficult airway.   The patient was then positioned in prone, jackknife position. Her buttock cheeks were taped apart. Sterile prep and drape  was performed. Timeout was observed.   Digital rectal examination demonstrated two dominant external hemorrhoids, one in the anterior midline and one in the posterior midline. The bivalve retractor was then inserted into the anal canal. Enema fluid was aspirated. The bowel prep was sufficient. Attention was first turned to the posterior midline hemorrhoidal mass, which was infiltrated at its base proximally with a total of 5 mL of 0.25% Marcaine with epinephrine.  A figure-of-eight #2-0 chromic suture was placed at the apex. A diamond-shaped hemorrhoidectomy was fashioned with a combination of pure cut on electrocautery as well as the application of the Harmonic scalpel. Specimen was handed off the field. The mucosal defect was closed overlying the sphincter utilizing a running locking 2-0 chromic suture with the opening at the most distal aspect of the skin for future drainage. In a similar fashion, the anterior midline hemorrhoidal tissue was excised. Once this was accomplished, there appeared to be no significant external hemorrhoidal tissue left. There was a small amount of external hemorrhoidal tissue present at the posterior right lateral  margin. I did not excise this. With hemostasis being ensured on the operative field, a total of 10 mL more of 0.25% Marcaine with epinephrine was infiltrated around the perianal tissues. This was done with a clean needle and Betadine scrub.   Final inspection demonstrated adequate hemostasis. A piece of vaginal packing soaked in 1% dubicaine ointment was inserted into the anal canal. Dressing consisting of ABDs and mesh surgical underwear was applied and the patient was then returned to the recovery room in stable and satisfactory condition, extubated by anesthesia.     ____________________________ Redge GainerMark A. Egbert GaribaldiBird, MD mab:rw D: 06/27/2013 13:15:52 ET T: 06/27/2013 16:41:07 ET JOB#: [id number]  cc: Loraine LericheMark A. Egbert GaribaldiBird, MD, <Dictator> Raynald KempMARK A Stirling Orton MD ELECTRONICALLY SIGNED 07/03/2013 7:54

## 2014-11-03 NOTE — Consult Note (Signed)
Referral Information:   Reason for Referral 39 year-old P1001 at 30 weeks here to review her sugar log.   Return visit for gestational diabetes. She is presently taking glyburide  in the am and 7.5mg  in the evening.  She reports elevations on evenings when she eats her evening snacks (usually peanut butter and crackers or cheese and crackers).  On evening when she does not take snacks (due to sleep or lack of hunger)    Referring Physician Defrancesco    Prenatal Hx as above    Past Obstetrical Hx 2001--cesarean GDM A1, 7lb 9 oz   Home Medications:  Prenatal Multivitamins oral tablet: 1 tab(s) orally once a day, Active  glyBURIDE 5 mg oral tablet: 1 tab(s) orally once a day (in the morning), Active  MiraLax: 1 dose(s) orally once a day, As Needed- for Constipation , Active  glyBURIDE: 7.5 milligram(s) orally once a day (at bedtime), Active  Allergies:   Tylenol: Hives  Vital Signs/Notes:  Nursing Vital Signs: **Vital Signs.:   14-Jan-13 08:50   Vital Signs Type Routine   Temperature Temperature (F) 98.3   Celsius 36.8   Temperature Source oral   Pulse Pulse 79   Pulse source per Dinamap   Respirations Respirations 16   Systolic BP Systolic BP 116   Diastolic BP (mmHg) Diastolic BP (mmHg) 57   Mean BP 76   BP Source Dinamap   Routine UA:  14-Jan-13 08:49    Clarity (UA) Hazy   Glucose (UA) Negative   Bilirubin (UA) Negative   Ketones (UA) Negative   Specific Gravity (UA) 1.004   Blood (UA) Negative   pH (UA) 7.0   Protein (UA) Negative   Nitrite (UA) Negative   Leukocyte Esterase (UA) Negative   Bacteria (UA) TRACE   Epithelial Cells (UA) 1 /HPF   Color (UA) Straw   Mucous (UA) PRESENT   Amorphous Crystal (UA) PRESENT     Additional Lab/Radiology Notes Blood Sugar log 12/30--1/14 FS: 85, 94, 82, 96, 120, 77, 91, 94, 103, 81, 69, 99, 114, 95, 91, 117 2hr PP breakfast: 72-132 (1 value of 137 today after breakfast) 2hr PP lunch: 100-130 (1 value of 142) 2hr PP  dinner: 107-136   Impression/Recommendations:   Impression 39 year-old P1101 at 61 6/7 with gestational diabetes requiring therapy.  She still has had overall improvement in her sugars while on glyburide.  Fastings still demonstrate sporadic elevations.  As noted, these elevations are typically associated with her evening snacks.  Since she is not experiencing evening hunger and no am hypoglycemia when she doesn't snack, I've advised her to cut out the evening snack(or consume protein).    Recommendations -Discontinue evening snack.  Continue glyburide 5 mg am, 7.5 mg pm -She will fax blood glucose values in 3 days for review and follow up in 2 weeks for follow up consult to review blood glucose log.  -Fetal growth scan scheduled in 2 weeks. -Start fetal testing at 32 weeks. We will be happy to perform if desired.   Plan:   Ultrasound at what gestational ages Monthly > 28 weeks    Delivery Mode Cesarean, desires repeat    Delivery at what gestational age [redacted] weeks, unless fetopathy noted     Total Time Spent with Patient 15 minutes    >50% of visit spent in couseling/coordination of care yes    Office Use Only 99213  Office Visit Level 3 ( ) EST exp prob focused outpt   Coding Description:  MATERNAL CONDITIONS/HISTORY INDICATION(S).   Diabetes - Gestational GDM.  Electronic Signatures: Consuelo PandySmall, Cavin Longman (MD)  (Signed 14-Jan-13 09:37)  Authored: Referral, Home Medications, Allergies, Vital Signs/Notes, Lab, Lab/Radiology Notes, Impression, Plan, Billing, Coding Description   Last Updated: 14-Jan-13 09:37 by Consuelo PandySmall, Yasuko Lapage (MD)

## 2014-11-03 NOTE — H&P (Signed)
PATIENT NAME:  Audrey Berry, Jalexa L MR#:  [id number] DATE OF BIRTH:  [birth year]  DATE OF ADMISSION:  09/28/2011  PREOPERATIVE DIAGNOSES:  1. 39.3 intrauterine pregnancy, undelivered.  2. Gestational diabetes mellitus A2.  3. Prior cesarean section delivery for failure to progress.  4. Desires elective sterilization.   HISTORY: Audrey Berry is a 39 year old married white female gravida 2, para 1-0-0-1, with an EDC of 10/02/2011, who presents on admission for repeat cesarean section delivery with bilateral partial salpingectomy. Patient's prenatal course has been notable for gestational diabetes mellitus which has been controlled initially with diet followed by metformin. Recently she has had to go on Lantus insulin therapy at bedtime for attempt at glycemic control in the last several weeks of pregnancy. Serial biophysical profiles by [facility] have been reassuring. Patient does desire definitive sterilization with this delivery. Other issues this pregnancy include advanced maternal age with negative first trimester screening. MSAFP screen also was negative. Patient was a tobacco user but quit this pregnancy. She has had history of GBS bacteruria.   PAST MEDICAL HISTORY:  1. Tobacco user.  2. Gestational diabetes mellitus.  3. History of PCOD.  4. ASB.   PAST SURGICAL HISTORY:  1. Primary low cervical transverse cesarean section.  2. Tonsillectomy and adenoidectomy.   PAST OB HISTORY: Para 1-0-0-1, C-section secondary to failure to progress.   FAMILY HISTORY: Patient has a brother who had spina bifida and was deceased after two days of age.   SOCIAL HISTORY: Patient does not drink. She did smoke 1/2 pack of cigarettes a day until onset of pregnancy. She denies drug use.   CURRENT MEDICATIONS:  1. Prenatal vitamins daily.  2. Lantus insulin 16 units at bedtime.   DRUG ALLERGIES: Tylenol.   PHYSICAL EXAMINATION:  VITAL SIGNS: Height 5 feet 2 inches, weight 172.2, blood pressure 123/87,  heart rate 80.   GENERAL: Patient is a pleasant well-appearing gravid white female in no acute distress. She is alert and oriented. Affect is appropriate.   HEENT: Oropharynx is clear.   NECK: Supple. There is no thyromegaly or adenopathy.   LUNGS: Clear.   HEART: Regular rate and rhythm. There is a 2/6 systolic ejection murmur noted.   ABDOMEN: Uterus is gravid with fundal height of about 35 cm. Fetal heart tones are normal in 140 beat per minute range. Cervix is fingertip 70%, -3.   EXTREMITIES: Without clubbing, cyanosis, or edema.   SKIN: Without rash.   MUSCULOSKELETAL: Exam is normal.   IMPRESSION:  1. 39.3 week intrauterine pregnancy, undelivered.  2. Gestational diabetes mellitus A2, under fair control.  3. History of previous cesarean section delivery.  4. History of GBS bacteriuria.  5. Desires sterilization.   PLAN: Repeat cesarean section delivery with bilateral partial salpingectomy. Date of surgery is 09/28/2011.    CONSENT NOTE: Audrey Polingaula Levingston is to undergo repeat C-section with tubal ligation. She is understanding of the planned procedures and aware of and is accepting of all surgical risks which include but are not limited to bleeding, infection, pelvic organ injury with need for repair, blood clot disorders, anesthesia risks, and death. Patient understands that the tubal ligation is permanent and not reversible. There is a failure rate of 1 out of 300. All questions are answered. Informed consent is given. Patient is ready and willing to proceed with surgery as scheduled.  ____________________________ Prentice DockerMartin A. DeFrancesco, MD mad:cms D: 09/24/2011 10:17:46 ET T: 09/24/2011 10:39:25 ET JOB#: [id number]  cc: Daphine DeutscherMartin A. DeFrancesco, MD, <Dictator>  MARTIN A  DEFRANCESCO MD ELECTRONICALLY SIGNED 10/02/2011 11:45

## 2014-11-03 NOTE — Consult Note (Signed)
Referral Information:   Reason for Referral 39 year-old Audrey Berry at 32 weeks 3 days here to review her sugar log.   Return visit for gestational diabetes. She is presently taking glyburide7.5mg  in the am and 10mg  in the evening.    Referring Physician Defrancesco    Prenatal Hx as above    Past Obstetrical Hx 2001--cesarean GDM A1, 7lb 9 oz   Home Medications:  Prenatal Multivitamins oral tablet: 1 tab(s) orally once a day, Active  MiraLax: 1 dose(s) orally once a day, As Needed- for Constipation , Active  glyBURIDE: 10 milligram(s) orally once a day (at bedtime), Active  glyBURIDE: 7.5 milligram(s) orally once a day (in the morning), Active  Allergies:   Tylenol: Hives  Vital Signs/Notes:  Nursing Vital Signs: **Vital Signs.:   28-Jan-13 13:00   Vital Signs Type Routine   Pulse Pulse 129   Pulse source per Dinamap   Respirations Respirations 14   Systolic BP Systolic BP 129   Diastolic BP (mmHg) Diastolic BP (mmHg) 74   Mean BP 92   BP Source Dinamap   Routine UA:  28-Jan-13 12:50    Clarity (UA) Hazy   Glucose (UA) Negative   Bilirubin (UA) Negative   Ketones (UA) Trace   Specific Gravity (UA) 1.009   Blood (UA) Negative   pH (UA) 7.0   Protein (UA) Negative   Nitrite (UA) Negative   Leukocyte Esterase (UA) Negative   WBC (UA) 2 /HPF   Bacteria (UA) TRACE   Epithelial Cells (UA) <1 /HPF   Color (UA) Straw   Mucous (UA) PRESENT   Amorphous Crystal (UA) PRESENT     Additional Lab/Radiology Notes FBS: 90-120 since 1/24 (last review) 2hr post breakfast: 123-142 2hr post lunch: 113-138 2hr post dinner:  136-170   Impression/Recommendations:   Impression 39 year-old Audrey Berry at 32 weeks 3 days with gestational diabetes, still with elevated postprandials, mostly in evenings.    Recommendations 1.  Increase glyburide to 10mg  in am and 10mg  in pm - will schedule f/up consult in 1 week to determine need to start insulin.  Compliance with diet and exercise  encouraged. 2.  Continue weekly NSTs in the office, [facility]. 3.  Repeat US for EFW in 4 weeks - will discuss delivery timing then.     Total Time Spent with Patient 10 min    >50% of visit spent in couseling/coordination of care yes    Office Use Only [id number]  Office Visit Level 2 (10min) EST prob focused office/outpt   Coding Description: MATERNAL CONDITIONS/HISTORY INDICATION(S).   Diabetes - Gestational GDM.  Electronic Signatures: Kirby FunkEllestad, Demarques Pilz (MD)  (Signed 28-Jan-13 13:51)  Authored: Referral, Home Medications, Allergies, Vital Signs/Notes, Lab, Lab/Radiology Notes, Impression, Billing, Coding Description   Last Updated: 28-Jan-13 13:51 by Kirby FunkEllestad, Billijo Dilling (MD)

## 2014-11-03 NOTE — Consult Note (Signed)
Referral Information:   Reason for Referral 39 year-old P1001 at 33 weeks 6 days here to review her sugar log.   Return visit for gestational diabetes. She is presently taking glyburide 10 mg in the am and 10mg  in the evening. BPP today is 10/10 .On her last scan , her fetus was at the 36th %ile EFW    Referring Physician Defrancesco    Prenatal Hx as above    Past Obstetrical Hx 2001--cesarean GDM A1, 7lb 9 oz   Home Medications: Medication Instructions Status  Prenatal Multivitamins oral tablet 1 tab(s) orally once a day Active  MiraLax 1 dose(s) orally once a day, As Needed- for Constipation  Active  glyBURIDE 10 milligram(s) orally once a day (at bedtime) Active  glyBURIDE 10 milligram(s) orally once a day (in the morning) Active   Allergies:   Tylenol: Hives  Vital Signs/Notes:  Nursing Vital Signs: **Vital Signs.:   07-Feb-13 08:22   Pulse Pulse 76   Pulse source per Dinamap   Respirations Respirations 12   Systolic BP Systolic BP 127   Diastolic BP (mmHg) Diastolic BP (mmHg) 71   Mean BP 89   BP Source Dinamap   Review Of Systems:   Subjective concerned bout lack of weight gain   Exam:   Today's Weight 167 no change   Routine UA:  07-Feb-13 08:19    Clarity (UA) Cloudy   Bilirubin (UA) Negative   Ketones (UA) Negative   Specific Gravity (UA) 1.017   Blood (UA) Negative   pH (UA) 7.0   Protein (UA) Negative   Nitrite (UA) Negative   Leukocyte Esterase (UA) Negative   WBC (UA) 1 /HPF   Bacteria (UA) TRACE   Epithelial Cells (UA) 1 /HPF   Color (UA) Yellow   Mucous (UA) PRESENT   Amorphous Crystal (UA) PRESENT  Blood Glucose:  07-Feb-13 08:26    POCT Blood Glucose 158     Additional Lab/Radiology Notes since 2/1 FBS: 69-134 (2/7 >100) 2hr post breakfast: 87-136 (2/6 >120) 2hr post lunch: 86-136 (2/6 >120) 2hr post dinner:  107-142 (3/6 >120) RBS today - 80 minutes after breakfast 158   Impression/Recommendations:   Impression 1-39 year-old  P1001 at 33 weeks 6 days with gestational diabetes on max glyburide , occ  elevated blood sugar most in range, appropriately grown fetus   2-Prior cesarean planning repeat  3 no influenza vaccine to date    Recommendations 1.  Contionue  glyburide to 10mg  in am and 10mg  in pm - will check hgb A1c today  schedule f/up consult in 1 week to review blood sugars -will not start insulin today, but getting close .  Continued compliance with diet and exercise encouraged. 2.  Continue weekly NSTs in the office, [facility] A[facility]. 3.  Repeat US for EFW on 2/28 - 4 39 week delivery reasonable with current blood sugar and no evidence of fetopathy 5 Offer influenza vacine and TDAP at primary OB - if flu sxs call for tamiflu   Plan:   Ultrasound at what gestational ages Monthly > 28 weeks    Antepartum Testing Twice weekly    Additional Testing HbA1C     Total Time Spent with Patient 15 minutes, 10 min    >50% of visit spent in couseling/coordination of care yes    Office Use Only [id number]  Office Visit Level 2 (10min) EST prob focused office/outpt   Coding Description: MATERNAL CONDITIONS/HISTORY INDICATION(S).  Diabetes - Gestational GDM.  Electronic Signatures: Rondall Allegra (MD)  (Signed 07-Feb-13 09:14)  Authored: Referral, Home Medications, Allergies, Vital Signs/Notes, Exam, Lab, Lab/Radiology Notes, Impression, Plan, Billing, Coding Description   Last Updated: 07-Feb-13 09:14 by Rondall Allegra (MD)

## 2014-11-03 NOTE — Consult Note (Signed)
Referral Information:   Reason for Referral 39 year-old P1001 at 37 weeks 3 days here to review her sugar log and BPP.   Return visit for gestational diabetes. She is using Lantus Insulin 4 u dailys. BPP today is 8/8.  She is scheduled for a planned cesarean on 09/27/11    Referring Physician Defrancesco    Prenatal Hx as above    Past Obstetrical Hx 2001--cesarean GDM A1, 7lb 9 oz- diet only   Allergies:   Tylenol: Hives  Vital Signs/Notes:  Nursing Vital Signs: **Vital Signs.:   04-Mar-13 08:29   Vital Signs Type Routine   Temperature Temperature (F) 98.2   Celsius 36.7   Temperature Source oral   Pulse Pulse 79   Pulse source per Dinamap   Respirations Respirations 12   Systolic BP Systolic BP 124   Diastolic BP (mmHg) Diastolic BP (mmHg) 66   Mean BP 85   BP Source Dinamap     Additional Lab/Radiology Notes On review of her [facility] log, I find that all her glucoses are elevated. Most ger glucoses are around 120-130.   Impression/Recommendations:   Impression 1-39 year-old P1001 at 37 weeks 3 days with gestational diabetes 2-Prior cesarean planning repeat on 3/18 3-Still with poor control    Recommendations 1. Increase Lantus to 8 units daily 2.  Continue weekly NSTs in the office, [facility] A[facility].   Plan:   Ultrasound at what gestational ages Monthly > 28 weeks    Antepartum Testing Twice weekly    Additional Testing HbA1C     Total Time Spent with Patient 15 minutes, 10 min    >50% of visit spent in couseling/coordination of care yes    Office Use Only [phone]  Office Visit Level 2 (10min) EST prob focused office/outpt   Coding Description: MATERNAL CONDITIONS/HISTORY INDICATION(S).  Electronic Signatures: Marcelino ScotBrancazio, Wiley Flicker (MD)  (Signed 04-Mar-13 10:17)  Authored: Referral, Allergies, Vital Signs/Notes, Lab/Radiology Notes, Impression, Plan, Billing, Coding Description   Last Updated: 04-Mar-13 10:17 by Marcelino ScotBrancazio, Kerrin Markman  (MD)

## 2014-11-03 NOTE — Consult Note (Signed)
Referral Information:   Reason for Referral 39 year-old P1001 at 35 weeks 6 days here to review her sugar log.   Return visit for gestational diabetes. She is presently taking glyburide 10 mg in the am and 10mg  in the evening. BPP today is 8/8 .On her last scan , her fetus was at the 36th %ile . She is scheduled for another growth scan on 2/28 and has a planned cesarean on 09/27/11    Referring Physician Defrancesco    Prenatal Hx as above    Past Obstetrical Hx 2001--cesarean GDM A1, 7lb 9 oz- diet only   Home Medications: Medication Instructions Status  Prenatal Multivitamins oral tablet 1 tab(s) orally once a day Active  MiraLax 1 dose(s) orally once a day, As Needed- for Constipation  Active  glyBURIDE 10 milligram(s) orally once a day (at bedtime) Active  glyBURIDE 10 milligram(s) orally once a day (in the morning) Active   Allergies:   Tylenol: Hives  Vital Signs/Notes:  Nursing Vital Signs: **Vital Signs.:   21-Feb-13 08:39   Vital Signs Type Routine   Pulse Pulse 74   Pulse source per Dinamap   Respirations Respirations 12   Systolic BP Systolic BP 118   Diastolic BP (mmHg) Diastolic BP (mmHg) 66   Mean BP 83   BP Source Dinamap   Review Of Systems:   Tolerating Diet Yes  hungry!!   Exam:   Today's Weight 170  1 lb incre   Routine UA:  21-Feb-13 08:27    Clarity (UA) Hazy   Ketones (UA) Negative   Specific Gravity (UA) 1.012   Blood (UA) Negative   pH (UA) 7.0   Protein (UA) Negative   Nitrite (UA) Negative   Leukocyte Esterase (UA) Negative   WBC (UA) 1 /HPF   Bacteria (UA) TRACE   Epithelial Cells (UA) 5 /HPF   RBC (UA) 1 /HPF   Color (UA) Yellow   Mucous (UA) PRESENT   Amorphous Crystal (UA) PRESENT  Blood Glucose:  21-Feb-13 08:33    POCT Blood Glucose 141     Additional Lab/Radiology Notes since 2/14 FBS: 87-151 (4/7 >100) 2hr post breakfast: 87-141 (2/7 >120) 2hr post lunch: 111-126 (2/6 >120) 2hr post dinner:  120-136 (5/6 >120) RBS  today - 80 minutes after breakfast 158   Impression/Recommendations:   Impression 1-39 year-old P1001 at 35 weeks 6 days with gestational diabetes on max glyburide at 10 bid  , occasionally  elevated blood sugar most in range though worsening , appropriately grown fetus  hgb A1c 6.2 2-Prior cesarean planning repeat on 3/18    Recommendations 1.  Continue  glyburide to 10mg  in am and 10mg  in pm  schedule f/up consult in 1 week to review blood sugars -will not start insulin today, but getting closer .  Continued compliance with diet and exercise encouraged.Add more vegetables to improve satiety. 2.  Continue weekly NSTs in the office, [facility]. 3.  Repeat US for EFW on 2/28 - 4 39 week delivery reasonable with current blood sugar and no evidence of fetopathy to date, if sugar trend continues upwards or if increasing AC or LGA at scan next week consider adding insulin versus amniocentesis and delivery, I discussed with patient that we would weigh her opinion in this - currently I think she would accept starting insulin if needed. I reviewed reportign to hospital if fetal movement declines.   Plan:   Ultrasound at what gestational ages  Monthly > 28 weeks    Antepartum Testing Twice weekly    Additional Testing HbA1C     Total Time Spent with Patient 15 minutes, 10 min    >50% of visit spent in couseling/coordination of care yes    Office Use Only [address]  Office Visit Level 2 ( ) EST prob focused office/outpt   Coding Description: MATERNAL CONDITIONS/HISTORY INDICATION(S).  Electronic Signatures: Rondall Allegra (MD)  (Signed 21-Feb-13 09:49)  Authored: Referral, Home Medications, Allergies, Vital Signs/Notes, Exam, Lab, Lab/Radiology Notes, Impression, Plan, Billing, Coding Description   Last Updated: 21-Feb-13 09:49 by Rondall Allegra (MD)

## 2014-11-03 NOTE — Op Note (Signed)
PATIENT NAME:  Audrey Berry, Audrey Berry MR#:  [id number] DATE OF BIRTH:  08-04-1975  DATE OF PROCEDURE:  09/28/2011  PREOPERATIVE DIAGNOSES:  1. 39.3 week intrauterine pregnancy, undelivered.  2. History of previous Cesarean section delivery.  3. Desires permanent sterilization.  4. A2 gestational diabetes mellitus.  5. GBS bacteria.   POSTOPERATIVE DIAGNOSES:  1. 39.3 week intrauterine pregnancy, delivered.  2. History of previous Cesarean section delivery.  3. Desires permanent sterilization.  4. A2 gestational diabetes mellitus.  5. GBS bacteria.  6. Viable female infant 6 pounds, 14 ounces.   OPERATIVE PROCEDURES:  1. Repeat Cesarean section. 2. Bilateral partial salpingectomy.   SURGEON: Prentice DockerMartin A. Kooper Chriswell, MD   FIRST ASSISTANT: None.   ANESTHESIA: Spinal.   INDICATIONS: The patient is a 39 year old white married female gravida 2 para 1-0-0-1 at 39.[redacted] weeks gestation who presents for repeat Cesarean section delivery. The patient does desire permanent sterilization. She had previous Cesarean section delivery done due to arrest of dilation. This pregnancy was complicated by A2 gestational diabetes mellitus which was under fair control. Antepartum testing with serial ultrasounds and BPPs were reassuring.   FINDINGS AT SURGERY: Viable female infant 6 pounds, 14 ounces having Apgars of 9 and 9 at one and five minutes respectively. The uterus, tubes, and ovaries were grossly normal. There were some adhesions adjacent to the left fallopian tube and mesosalpinx which were lysed.   DESCRIPTION OF PROCEDURE: The patient was brought to the operating room where she was placed in the sitting position. Spinal anesthetic was introduced without difficulty. She was placed in the supine position with a right lateral hip roll in place. Foley catheter was placed and was draining clear yellow urine. A ChloraPrep abdominal and perineal prep and drape was performed in the standard fashion. After assessing for  adequate level of anesthesia, the Pfannenstiel incision was made in the abdomen. The fascia was incised along the midline raphe and this was extended bilaterally with Mayo scissors. The rectus muscle was dissected off the fascia through sharp dissection superiorly and inferiorly. The midline raphe was opened and separated and the peritoneum was entered. Bladder flap was created over lower uterine segment through sharp dissection. Low transverse incision was made in the uterus and this was extended bluntly bilaterally. The infant was delivered through a vertex presentation in a routine manner. The infant was vigorous at birth. The umbilical cord was doubly clamped and cut and the infant was handed off to the awaiting resuscitation team. Cord blood sampling was obtained. Placenta was expressed from the uterus. The uterus was externalized onto the anterior abdominal wall and cleared of all debris with laps. The incision was closed in two layers using #1 chromic suture. First layer was a running locking stitch, second layer was an imbricating layer.   Bilateral partial salpingectomy was then performed in routine fashion. Some adhesions along the left fallopian tube were lysed. Midsegment portion of the tube was grasped with Babcock clamp. A free tie was used to ligate a midsegment portion of tube. This was followed by a stick tie. The intervening tube segment was resected. This was done on the contralateral tube as well. Good hemostasis was obtained.   Uterus was placed back into the abdominopelvic cavity. Gutters were cleared of all debris with laps. The incision was then closed in layers using 0 Maxon in a simple running manner. The skin was closed with staples. Pressure dressing was applied. The patient was then mobilized and taken to the recovery room in satisfactory  condition.   Estimated blood loss was 500 mL. IV fluids were 2000 mL of Crystalloid. All instruments, needles, and sponge counts were verified as  correct.    ____________________________ Prentice Docker. Tyleek Smick, MD mad:drc D: 09/28/2011 17:44:42 ET T: 09/29/2011 09:19:16 ET JOB#: [id number]  cc: Daphine Deutscher A. Tiarna Koppen, MD, <Dictator> Prentice Docker Zian Mohamed MD ELECTRONICALLY SIGNED 10/02/2011 11:45

## 2014-11-19 NOTE — H&P (Signed)
L&D Evaluation:  History:   Presents with A/P Testing    Patient's Medical History GDMA2    Patient's Surgical History Previous C-Section    Medications Pre Natal Vitamins  Iron  Lantus 4 mg qhs   Exam:   FHT NST Reactive   Impression:   Impression 36 week Intrauterine pregnancy; GDMA2   Plan:   Comments Lantus started today    Follow Up Appointment already scheduled   Electronic Signatures: DeFrancesco, Prentice DockerMartin A (MD)  (Signed 28-Feb-13 13:07)  Authored: L&D Evaluation   Last Updated: 28-Feb-13 13:07 by DeFrancesco, Prentice DockerMartin A (MD)

## 2014-11-19 NOTE — H&P (Signed)
L&D Evaluation:  History:   HPI 34.5 week Intrauterine pregnancy    Presents with NST    Patient's Medical History GDM A2    Medications Pre Natal Vitamins  Glyburide 10.0/10.0mg  am/pm    Social History none    Family History Non-Contributory   ROS:   ROS All systems were reviewed.  HEENT, CNS, GI, GU, Respiratory, CV, Renal and Musculoskeletal systems were found to be normal.   Exam:   Vital Signs stable    Urine Protein negative dipstick    General no apparent distress    Mental Status clear    Abdomen gravid, non-tender    Estimated Fetal Weight Average for gestational age    Fundal Height 31 cm    Edema no edema    FHT NST Reactive   Impression:   Impression reactive NST, 35.[redacted] weeks gestation; GDMA2   Plan:   Plan Biweekly NST; Weekly BPP ([facility])    Follow Up Appointment already scheduled   Electronic Signatures: DeFrancesco, Prentice DockerMartin A (MD)  (Signed 14-Feb-13 12:23)  Authored: L&D Evaluation   Last Updated: 14-Feb-13 12:23 by DeFrancesco, Prentice DockerMartin A (MD)

## 2014-11-19 NOTE — H&P (Signed)
L&D Evaluation:  History:   Presents with A/P Testing    Patient's Medical History GDM    Medications Motrin (Ibuprofen)   Exam:   Vital Signs stable    Urine Protein negative dipstick    General no apparent distress    Mental Status clear    Abdomen gravid, non-tender    Estimated Fetal Weight Small for gestational age    Fundal Height 31    Back no CVAT    Edema no edema    Pelvic no external lesions, 50/closed/high    Mebranes Intact    FHT NST Reactive    Skin dry    Lymph no lymphadenopathy   Impression:   Impression reactive NST, 36.6 week Intrauterine pregnancy; GDMA1   Plan:   Plan discharge, BIW NST    Follow Up Appointment already scheduled   Electronic Signatures: Nadezhda Pollitt, Prentice DockerMartin A (MD)  (Signed 21-Feb-13 13:40)  Authored: L&D Evaluation   Last Updated: 21-Feb-13 13:40 by Pranika Finks, Prentice DockerMartin A (MD)

## 2014-11-19 NOTE — H&P (Signed)
L&D Evaluation:  History:   HPI 31.2 week Intrauterine pregnancy    Presents with NST    Patient's Medical History GDM A2    Medications Pre Natal Vitamins  Glyburide 10.0/7.5    Social History none   Exam:   Estimated Fetal Weight Average for gestational age    FHT NST Reactive   Impression:   Impression reactive NST   Plan:   Plan Biweekly NST    Follow Up Appointment already scheduled   Electronic Signatures: Freeland Pracht, Prentice DockerMartin A (MD)  (Signed 21-Jan-13 12:06)  Authored: L&D Evaluation   Last Updated: 21-Jan-13 12:06 by Venola Castello, Prentice DockerMartin A (MD)

## 2014-11-19 NOTE — H&P (Signed)
L&D Evaluation:  History:   HPI 32.2 week Intrauterine pregnancy    Presents with NST    Patient's Medical History GDM A2    Medications Pre Natal Vitamins  Glyburide 10.0/7.5    Social History none   Exam:   Vital Signs 138/78    Urine Protein negative dipstick    General no apparent distress    Abdomen gravid, non-tender    Estimated Fetal Weight Average for gestational age    Fundal Height 32    Edema no edema    FHT NST Reactive   Impression:   Impression reactive NST, 32.2 week Intrauterine pregnancy; GDMA2   Plan:   Plan Biweekly NST    Follow Up Appointment already scheduled   Electronic Signatures: Hutchinson Isenberg, Prentice DockerMartin A (MD)  (Signed 28-Jan-13 12:33)  Authored: L&D Evaluation   Last Updated: 28-Jan-13 12:33 by Nimco Bivens, Prentice DockerMartin A (MD)

## 2014-11-19 NOTE — H&P (Signed)
L&D Evaluation:  History:   HPI 31.5 week Intrauterine pregnancy    Presents with NST    Patient's Medical History GDM A2    Medications Pre Natal Vitamins  Glyburide 10.0/7.5    Social History none   Exam:   Vital Signs stable    Estimated Fetal Weight Average for gestational age    FHT NST Reactive   Impression:   Impression reactive NST, 31.5 week Intrauterine pregnancy   Plan:   Plan Biweekly NST    Follow Up Appointment already scheduled   Electronic Signatures: Shanyce Daris, Prentice DockerMartin A (MD)  (Signed 24-Jan-13 14:07)  Authored: L&D Evaluation   Last Updated: 24-Jan-13 14:07 by Prescott Truex, Prentice DockerMartin A (MD)

## 2016-10-20 ENCOUNTER — Ambulatory Visit
Admission: EM | Admit: 2016-10-20 | Discharge: 2016-10-20 | Disposition: A | Discharge status: Home / Self Care | Payer: 59 | Attending: Family Medicine | Admitting: Family Medicine

## 2016-10-20 ENCOUNTER — Encounter: Payer: Self-pay | Admitting: *Deleted

## 2016-10-20 DIAGNOSIS — R300 Dysuria: Secondary | ICD-10-CM | POA: Diagnosis not present

## 2016-10-20 HISTORY — DX: Polycystic ovarian syndrome: E28.2

## 2016-10-20 LAB — URINALYSIS, COMPLETE (UACMP) WITH MICROSCOPIC
Bacteria, UA: NONE SEEN
Bilirubin Urine: NEGATIVE
Glucose, UA: NEGATIVE mg/dL
KETONES UR: NEGATIVE mg/dL
LEUKOCYTES UA: NEGATIVE
Nitrite: NEGATIVE
PROTEIN: NEGATIVE mg/dL
RBC / HPF: NONE SEEN RBC/hpf (ref 0–5)
Specific Gravity, Urine: <1.005 — ABNORMAL LOW (ref 1.005–1.030)
pH: 6.5 (ref 5.0–8.0)

## 2016-10-20 MED ORDER — SULFAMETHOXAZOLE-TRIMETHOPRIM 800-160 MG PO TABS: 1.0000 | ORAL_TABLET | Freq: Two times a day (BID) | ORAL | 0 refills | Status: DC

## 2016-10-20 NOTE — ED Provider Notes (Signed)
[facility]    CSN: [id number] Arrival date & time: 10/20/16  1343     History   Chief Complaint Chief Complaint  Patient presents with  . Dysuria  . Urinary Frequency    HPI Audrey Berry is a 41 y.o. female.   The history is provided by the patient.  Dysuria  Pain quality:  Burning Pain severity:  Moderate Onset quality:  Sudden Duration:  5 days Timing:  Constant Progression:  Worsening Chronicity:  New Recent urinary tract infections: no   Relieved by:  Cranberry juice (has been drinking more water and pure cranberry juice and states symptoms improved today) Worsened by:  Nothing Urinary symptoms: frequent urination   Associated symptoms: no abdominal pain, no fever, no flank pain, no genital lesions, no nausea, no vaginal discharge and no vomiting   Risk factors: no hx of pyelonephritis, no hx of urolithiasis, no kidney transplant, not pregnant, no recurrent urinary tract infections, no renal cysts, no renal disease, not sexually active, no sexually transmitted infections, no single kidney and no urinary catheter   Urinary Frequency  Pertinent negatives include no abdominal pain.    Past Medical History:  Diagnosis Date  . Polycystic disease, ovaries     There are no active problems to display for this patient.   Past Surgical History:  Procedure Laterality Date  . CESAREAN SECTION    . HEMORROIDECTOMY      OB History    No data available       Home Medications    Prior to Admission medications   Medication Sig Start Date End Date Taking? Authorizing Provider  sulfamethoxazole-trimethoprim (BACTRIM DS,SEPTRA DS) 800-160 MG tablet Take 1 tablet by mouth 2 (two) times daily. 10/20/16   Payton Mccallum, MD    Family History History reviewed. No pertinent family history.  Social History Social History  Substance Use Topics  . Smoking status: Current Every Day Smoker  . Smokeless tobacco: Never Used  . Alcohol use Yes      Allergies   Acetaminophen   Review of Systems Review of Systems  Constitutional: Negative for fever.  Gastrointestinal: Negative for abdominal pain, nausea and vomiting.  Genitourinary: Positive for dysuria and frequency. Negative for flank pain and vaginal discharge.     Physical Exam Triage Vital Signs ED Triage Vitals  Enc Vitals Group     BP 10/20/16 1432 125/80     Pulse Rate 10/20/16 1432 72     Resp 10/20/16 1432 16     Temp 10/20/16 1432 98.4 F (36.9 C)     Temp Source 10/20/16 1432 Oral     SpO2 10/20/16 1432 100 %     Weight 10/20/16 1433 168 lb (76.2 kg)     Height 10/20/16 1433  (1.549 m)     Head Circumference --      Peak Flow --      Pain Score --      Pain Loc --      Pain Edu? --      Excl. in GC? --    No data found.   Updated Vital Signs BP 125/80 (BP Location: Left Arm)   Pulse 72   Temp 98.4 F (36.9 C) (Oral)   Resp 16   Ht  (1.549 m)   Wt 168 lb (76.2 kg)   LMP 10/06/2016 (Approximate)   SpO2 100%   BMI 31.74 kg/m   Visual Acuity Right Eye Distance:   Left Eye  Distance:   Bilateral Distance:    Right Eye Near:   Left Eye Near:    Bilateral Near:     Physical Exam  Constitutional: She appears well-developed and well-nourished. No distress.  Abdominal: Soft.  Skin: She is not diaphoretic.  Nursing note and vitals reviewed.    UC Treatments / Results  Labs (all labs ordered are listed, but only abnormal results are displayed) Labs Reviewed  URINALYSIS, COMPLETE (UACMP) WITH MICROSCOPIC - Abnormal; Notable for the following:       Result Value   Color, Urine STRAW (*)    Specific Gravity, Urine <1.005 (*)    Hgb urine dipstick TRACE (*)    Squamous Epithelial / LPF 0-5 (*)    All other components within normal limits    EKG  EKG Interpretation None       Radiology No results found.  Procedures Procedures (including critical care time)  Medications Ordered in UC Medications - No data to  display   Initial Impression / Assessment and Plan / UC Course  I have reviewed the triage vital signs and the nursing notes.  Pertinent labs & imaging results that were available during my care of the patient were reviewed by me and considered in my medical decision making (see chart for details).       Final Clinical Impressions(s) / UC Diagnoses   Final diagnoses:  Dysuria    New Prescriptions Discharge Medication List as of 10/20/2016  3:24 PM    START taking these medications   Details  sulfamethoxazole-trimethoprim (BACTRIM DS,SEPTRA DS) 800-160 MG tablet Take 1 tablet by mouth 2 (two) times daily., Starting Wed 10/20/2016, Print       1. Lab results and diagnosis reviewed with patient 2. rx as per orders above; reviewed possible side effects, interactions, risks and benefits  3. Recommend supportive treatment with continued increased fluids 4. Follow-up prn if symptoms worsen or don't improve   Payton Mccallum, MD 10/20/16 1745

## 2016-10-20 NOTE — ED Triage Notes (Signed)
Dysuria, urinary frequency x several days. Denies other symptoms.

## 2022-10-20 ENCOUNTER — Ambulatory Visit: Payer: Medicaid Other | Admitting: Family

## 2022-10-20 ENCOUNTER — Encounter: Payer: Self-pay | Admitting: Family

## 2022-10-20 VITALS — BP 140/90 | HR 76 | Ht 62.0 in | Wt 165.4 lb

## 2022-10-20 DIAGNOSIS — R7303 Prediabetes: Secondary | ICD-10-CM | POA: Diagnosis not present

## 2022-10-20 DIAGNOSIS — J301 Allergic rhinitis due to pollen: Secondary | ICD-10-CM

## 2022-10-20 DIAGNOSIS — I10 Essential (primary) hypertension: Secondary | ICD-10-CM | POA: Diagnosis not present

## 2022-10-20 DIAGNOSIS — E538 Deficiency of other specified B group vitamins: Secondary | ICD-10-CM | POA: Diagnosis not present

## 2022-10-20 DIAGNOSIS — R030 Elevated blood-pressure reading, without diagnosis of hypertension: Secondary | ICD-10-CM

## 2022-10-20 DIAGNOSIS — R5383 Other fatigue: Secondary | ICD-10-CM

## 2022-10-20 DIAGNOSIS — H66013 Acute suppurative otitis media with spontaneous rupture of ear drum, bilateral: Secondary | ICD-10-CM

## 2022-10-20 DIAGNOSIS — H6993 Unspecified Eustachian tube disorder, bilateral: Secondary | ICD-10-CM

## 2022-10-20 DIAGNOSIS — E559 Vitamin D deficiency, unspecified: Secondary | ICD-10-CM

## 2022-10-20 DIAGNOSIS — E782 Mixed hyperlipidemia: Secondary | ICD-10-CM

## 2022-10-20 MED ORDER — FEXOFENADINE HCL 180 MG PO TABS: 180.0000 mg | ORAL_TABLET | Freq: Every day | ORAL | 1 refills | Status: DC

## 2022-10-20 MED ORDER — PREDNISONE 20 MG PO TABS: ORAL_TABLET | ORAL | 0 refills | Status: DC

## 2022-10-20 NOTE — Progress Notes (Signed)
Established Patient Office Visit  Subjective:  Patient ID: Audrey Berry, female    DOB: 1975-09-21  Age: 47 y.o. MRN: [id number]  Chief Complaint  Patient presents with   Establish Care    New Patient    Patient here today to establish care and with complaints about her ears.  She went to UC recently with complaints of bilateral ear pain, and now she's feeling better, the pain has resolved, but she is still experiencing ear fullness.      Ear Fullness  There is pain in both ears. This is a recurrent problem. The current episode started 1 to 4 weeks ago. The problem occurs constantly. The problem has been unchanged. There has been no fever. The pain is at a severity of 6/10. The pain is moderate. She has tried antibiotics, NSAIDs and acetaminophen for the symptoms. The treatment provided moderate relief.   Her blood pressure has also been elevated, but she has been taking sudafed fairly regularly as she has been having congestion.   No other concerns at this time.   Past Medical History:  Diagnosis Date   Polycystic disease, ovaries     Past Surgical History:  Procedure Laterality Date   CESAREAN SECTION  2001   at [facility]   CESAREAN SECTION  2013   [facility]   HEMORROIDECTOMY  2014   TUBAL LIGATION  2013   [facility]    Social History   Socioeconomic History   Marital status: Married    Spouse name: Not on file   Number of children: Not on file   Years of education: Not on file   Highest education level: Not on file  Occupational History   Not on file  Tobacco Use   Smoking status: Every Day    Packs/day: 1.00    Years: 15.00    Additional pack years: 0.00    Total pack years: 15.00    Types: Cigarettes   Smokeless tobacco: Never  Substance and Sexual Activity   Alcohol use: Not Currently   Drug use: Not Currently   Sexual activity: Yes    Partners: Male    Birth control/protection: Surgical    Comment: s/p tubal ligation, 2013  Other Topics Concern   Not on file   Social History Narrative   Not on file   Social Determinants of Health   Financial Resource Strain: Not on file  Food Insecurity: Not on file  Transportation Needs: Not on file  Physical Activity: Not on file  Stress: Not on file  Social Connections: Not on file  Intimate Partner Violence: Not on file    Family History  Problem Relation Age of Onset   Cancer Mother    Heart disease Father     Allergies  Allergen Reactions   Acetaminophen Hives    Review of Systems  HENT:  Positive for ear pain.        Ear fullness  All other systems reviewed and are negative.      Objective:   BP (!) 140/90   Pulse 76   Ht 5\' 2"  (1.575 m)   Wt 165 lb 6.4 oz (75 kg)   SpO2 98%   BMI 30.25 kg/m   Vitals:   10/20/22 0949  BP: (!) 140/90  Pulse: 76  Height: 5\' 2"  (1.575 m)  Weight: 165 lb 6.4 oz (75 kg)  SpO2: 98%  BMI (Calculated): 30.24    Physical Exam Vitals and nursing note reviewed.  Constitutional:  General: She is awake. She is not in acute distress.    Appearance: Normal appearance. She is overweight. She is not ill-appearing.  HENT:     Head: Normocephalic.     Right Ear: External ear normal. Tympanic membrane is scarred and perforated.     Left Ear: External ear normal. Swelling present. Tympanic membrane is erythematous.     Ears:   Eyes:     Pupils: Pupils are equal, round, and reactive to light.  Cardiovascular:     Rate and Rhythm: Normal rate.  Pulmonary:     Effort: Pulmonary effort is normal.  Neurological:     Mental Status: She is alert.  Psychiatric:        Behavior: Behavior is cooperative.      No results found for any visits on 10/20/22.   Assessment & Plan:   Problem List Items Addressed This Visit   None Visit Diagnoses     Eustachian tube dysfunction, bilateral  (Chronic)   -  Primary   Sending fexofenadine and prednisone.  Continue nasal sprays as previously directed.   B12 deficiency due to diet       Relevant  Orders   CBC With Differential   CMP14+EGFR   Vitamin B12   Prediabetes       Relevant Orders   CBC With Differential   CMP14+EGFR   Hemoglobin A1c   Vitamin D deficiency, unspecified       Relevant Orders   VITAMIN D 25 Hydroxy (Vit-D Deficiency, Fractures)   CBC With Differential   CMP14+EGFR   Other fatigue       Relevant Orders   CBC With Differential   CMP14+EGFR   TSH   Essential hypertension, benign       Relevant Orders   CBC With Differential   CMP14+EGFR   Mixed hyperlipidemia       Relevant Orders   Lipid panel   CBC With Differential   CMP14+EGFR   Acute suppurative otitis media of both ears with spontaneous rupture of tympanic membranes, recurrence not specified       Elevated BP without diagnosis of hypertension       Seasonal allergic rhinitis due to pollen           Return in about 2 weeks (around 11/03/2022) for F/U.   Total time spent: 30 minutes  Miki Kins, FNP  10/20/2022

## 2022-10-21 LAB — CBC WITH DIFFERENTIAL
Basophils Absolute: 0.1 10*3/uL (ref 0.0–0.2)
Basos: 1 %
EOS (ABSOLUTE): 0.3 10*3/uL (ref 0.0–0.4)
Eos: 2 %
Hematocrit: 38.9 % (ref 34.0–46.6)
Hemoglobin: 13.1 g/dL (ref 11.1–15.9)
Immature Grans (Abs): 0 10*3/uL (ref 0.0–0.1)
Immature Granulocytes: 0 %
Lymphocytes Absolute: 2.8 10*3/uL (ref 0.7–3.1)
Lymphs: 25 %
MCH: 28.7 pg (ref 26.6–33.0)
MCHC: 33.7 g/dL (ref 31.5–35.7)
MCV: 85 fL (ref 79–97)
Monocytes Absolute: 0.7 10*3/uL (ref 0.1–0.9)
Monocytes: 6 %
Neutrophils Absolute: 7.5 10*3/uL — ABNORMAL HIGH (ref 1.4–7.0)
Neutrophils: 66 %
RBC: 4.57 x10E6/uL (ref 3.77–5.28)
RDW: 12.5 % (ref 11.7–15.4)
WBC: 11.4 10*3/uL — ABNORMAL HIGH (ref 3.4–10.8)

## 2022-10-21 LAB — TSH: TSH: 1.16 u[IU]/mL (ref 0.450–4.500)

## 2022-10-21 LAB — CMP14+EGFR
ALT: 8 IU/L (ref 0–32)
AST: 9 IU/L (ref 0–40)
Albumin/Globulin Ratio: 1.5 (ref 1.2–2.2)
Albumin: 4.4 g/dL (ref 3.9–4.9)
Alkaline Phosphatase: 81 IU/L (ref 44–121)
BUN/Creatinine Ratio: 16 (ref 9–23)
BUN: 11 mg/dL (ref 6–24)
Bilirubin Total: <0.2 mg/dL (ref 0.0–1.2)
CO2: 21 mmol/L (ref 20–29)
Calcium: 9.6 mg/dL (ref 8.7–10.2)
Chloride: 97 mmol/L (ref 96–106)
Creatinine, Ser: 0.67 mg/dL (ref 0.57–1.00)
Globulin, Total: 3 g/dL (ref 1.5–4.5)
Glucose: 133 mg/dL — ABNORMAL HIGH (ref 70–99)
Potassium: 4.7 mmol/L (ref 3.5–5.2)
Sodium: 132 mmol/L — ABNORMAL LOW (ref 134–144)
Total Protein: 7.4 g/dL (ref 6.0–8.5)
eGFR: 107 mL/min/{1.73_m2} (ref >59)

## 2022-10-21 LAB — LIPID PANEL
Chol/HDL Ratio: 6.4 ratio — ABNORMAL HIGH (ref 0.0–4.4)
Cholesterol, Total: 211 mg/dL — ABNORMAL HIGH (ref 100–199)
HDL: 33 mg/dL — ABNORMAL LOW (ref >39)
LDL Chol Calc (NIH): 86 mg/dL (ref 0–99)
Triglycerides: 566 mg/dL (ref 0–149)
VLDL Cholesterol Cal: 92 mg/dL — ABNORMAL HIGH (ref 5–40)

## 2022-10-21 LAB — VITAMIN B12: Vitamin B-12: 427 pg/mL (ref 232–1245)

## 2022-10-21 LAB — HEMOGLOBIN A1C
Est. average glucose Bld gHb Est-mCnc: 194 mg/dL
Hgb A1c MFr Bld: 8.4 % — ABNORMAL HIGH (ref 4.8–5.6)

## 2022-10-21 LAB — VITAMIN D 25 HYDROXY (VIT D DEFICIENCY, FRACTURES): Vit D, 25-Hydroxy: 5.5 ng/mL — ABNORMAL LOW (ref 30.0–100.0)

## 2022-11-01 ENCOUNTER — Ambulatory Visit: Payer: Medicaid Other | Admitting: Family

## 2022-11-01 ENCOUNTER — Encounter: Payer: Self-pay | Admitting: Family

## 2022-11-01 VITALS — BP 158/88 | HR 88 | Ht 62.0 in | Wt 163.4 lb

## 2022-11-01 DIAGNOSIS — E1165 Type 2 diabetes mellitus with hyperglycemia: Secondary | ICD-10-CM

## 2022-11-01 DIAGNOSIS — E559 Vitamin D deficiency, unspecified: Secondary | ICD-10-CM

## 2022-11-01 DIAGNOSIS — J301 Allergic rhinitis due to pollen: Secondary | ICD-10-CM | POA: Diagnosis not present

## 2022-11-01 DIAGNOSIS — E782 Mixed hyperlipidemia: Secondary | ICD-10-CM

## 2022-11-01 DIAGNOSIS — I10 Essential (primary) hypertension: Secondary | ICD-10-CM

## 2022-11-01 MED ORDER — VITAMIN D (ERGOCALCIFEROL) 1.25 MG (50000 UNIT) PO CAPS: 50000.0000 [IU] | ORAL_CAPSULE | ORAL | 1 refills | Status: AC

## 2022-11-01 MED ORDER — AZELASTINE HCL 0.1 % NA SOLN: 2.0000 | Freq: Two times a day (BID) | NASAL | 1 refills | Status: AC

## 2022-11-01 MED ORDER — ICOSAPENT ETHYL 1 G PO CAPS: 2.0000 g | ORAL_CAPSULE | Freq: Two times a day (BID) | ORAL | 5 refills | Status: AC

## 2022-11-02 ENCOUNTER — Encounter: Payer: Self-pay | Admitting: Family

## 2022-11-02 DIAGNOSIS — E1165 Type 2 diabetes mellitus with hyperglycemia: Secondary | ICD-10-CM | POA: Insufficient documentation

## 2022-11-02 DIAGNOSIS — E559 Vitamin D deficiency, unspecified: Secondary | ICD-10-CM | POA: Insufficient documentation

## 2022-11-02 DIAGNOSIS — I10 Essential (primary) hypertension: Secondary | ICD-10-CM | POA: Insufficient documentation

## 2022-11-02 DIAGNOSIS — J301 Allergic rhinitis due to pollen: Secondary | ICD-10-CM | POA: Insufficient documentation

## 2022-11-02 DIAGNOSIS — E782 Mixed hyperlipidemia: Secondary | ICD-10-CM | POA: Insufficient documentation

## 2022-11-02 MED ORDER — LOSARTAN POTASSIUM 50 MG PO TABS: 50.0000 mg | ORAL_TABLET | Freq: Every day | ORAL | 1 refills | Status: DC

## 2022-11-02 NOTE — Progress Notes (Signed)
Established Patient Office Visit  Subjective:  Patient ID: Audrey Berry, female    DOB: 06/18/1976  Age: 47 y.o. MRN: [id number]  Chief Complaint  Patient presents with   Follow-up    2 week follow up    Pt. Here today for 2 week n/p follow up.   Had labs done at that time, so we will review in detail today.  Labs: Cholesterol was elevated, Triglycerides were critical. A1C shows diabetes, Vitamin D was very low.  Her ears are still bothering her. She was not able to get the allegra.  No other concerns today.      No other concerns at this time.   Past Medical History:  Diagnosis Date   Gestational diabetes    Polycystic disease, ovaries     Past Surgical History:  Procedure Laterality Date   CESAREAN SECTION  2001   at [facility]   CESAREAN SECTION  2013   [facility]   HEMORROIDECTOMY  2014   TUBAL LIGATION  2013   [facility]    Social History   Socioeconomic History   Marital status: Married    Spouse name: Not on file   Number of children: Not on file   Years of education: Not on file   Highest education level: Not on file  Occupational History   Not on file  Tobacco Use   Smoking status: Every Day    Packs/day: 1.00    Years: 15.00    Additional pack years: 0.00    Total pack years: 15.00    Types: Cigarettes   Smokeless tobacco: Never  Substance and Sexual Activity   Alcohol use: Not Currently   Drug use: Not Currently   Sexual activity: Yes    Partners: Male    Birth control/protection: Surgical    Comment: s/p tubal ligation, 2013  Other Topics Concern   Not on file  Social History Narrative   Not on file   Social Determinants of Health   Financial Resource Strain: Not on file  Food Insecurity: Not on file  Transportation Needs: Not on file  Physical Activity: Not on file  Stress: Not on file  Social Connections: Not on file  Intimate Partner Violence: Not on file    Family History  Problem Relation Age of Onset   Cancer Mother    Heart  disease Father     Allergies  Allergen Reactions   Acetaminophen Hives    Review of Systems  All other systems reviewed and are negative.      Objective:   BP (!) 158/88   Pulse 88   Ht 5\' 2"  (1.575 m)   Wt 163 lb 6.4 oz (74.1 kg)   SpO2 97%   BMI 29.89 kg/m   Vitals:   11/01/22 1454  BP: (!) 158/88  Pulse: 88  Height: 5\' 2"  (1.575 m)  Weight: 163 lb 6.4 oz (74.1 kg)  SpO2: 97%  BMI (Calculated): 29.88    Physical Exam Vitals and nursing note reviewed.  Constitutional:      Appearance: Normal appearance. She is normal weight.  HENT:     Head: Normocephalic.  Eyes:     Pupils: Pupils are equal, round, and reactive to light.  Cardiovascular:     Rate and Rhythm: Normal rate.  Pulmonary:     Effort: Pulmonary effort is normal.  Neurological:     General: No focal deficit present.     Mental Status: She is alert and oriented to person,  place, and time. Mental status is at baseline.  Psychiatric:        Mood and Affect: Mood normal.        Behavior: Behavior normal.        Thought Content: Thought content normal.        Judgment: Judgment normal.      No results found for any visits on 11/01/22.  Recent Results (from the past 2160 hour(s))  Lipid panel     Status: Abnormal   Collection Time: 10/20/22 10:37 AM  Result Value Ref Range   Cholesterol, Total 211 (H) 100 - 199 mg/dL   Triglycerides 161 (HH) 0 - 149 mg/dL   HDL 33 (L) >09 mg/dL   VLDL Cholesterol Cal 92 (H) 5 - 40 mg/dL   LDL Chol Calc (NIH) 86 0 - 99 mg/dL   Chol/HDL Ratio 6.4 (H) 0.0 - 4.4 ratio    Comment:                                   T. Chol/HDL Ratio                                             Men  Women                               1/2 Avg.Risk  3.4    3.3                                   Avg.Risk  5.0    4.4                                2X Avg.Risk  9.6    7.1                                3X Avg.Risk 23.4   11.0   VITAMIN D 25 Hydroxy (Vit-D Deficiency, Fractures)      Status: Abnormal   Collection Time: 10/20/22 10:37 AM  Result Value Ref Range   Vit D, 25-Hydroxy 5.5 (L) 30.0 - 100.0 ng/mL    Comment: Vitamin D deficiency has been defined by the Institute of Medicine and an Endocrine Society practice guideline as a level of serum 25-OH vitamin D less than 20 ng/mL (1,2). The Endocrine Society went on to further define vitamin D insufficiency as a level between 21 and 29 ng/mL (2). 1. IOM (Institute of Medicine). 2010. Dietary reference    intakes for calcium and D. [place]: The    [employer]. 2. Holick MF, Binkley [place], Bischoff-Ferrari HA, et al.    Evaluation, treatment, and prevention of vitamin D    deficiency: an Endocrine Society clinical practice    guideline. JCEM. 2011 Jul; 96(7):1911-30.   CBC With Differential     Status: Abnormal   Collection Time: 10/20/22 10:37 AM  Result Value Ref Range   WBC 11.4 (H) 3.4 - 10.8 x10E3/uL   RBC 4.57 3.77 - 5.28 x10E6/uL   Hemoglobin 13.1 11.1 - 15.9 g/dL  Hematocrit 38.9 34.0 - 46.6 %   MCV 85 79 - 97 fL   MCH 28.7 26.6 - 33.0 pg   MCHC 33.7 31.5 - 35.7 g/dL   RDW 16.1 09.6 - 04.5 %   Neutrophils 66 Not Estab. %   Lymphs 25 Not Estab. %   Monocytes 6 Not Estab. %   Eos 2 Not Estab. %   Basos 1 Not Estab. %   Neutrophils Absolute 7.5 (H) 1.4 - 7.0 x10E3/uL   Lymphocytes Absolute 2.8 0.7 - 3.1 x10E3/uL   Monocytes Absolute 0.7 0.1 - 0.9 x10E3/uL   EOS (ABSOLUTE) 0.3 0.0 - 0.4 x10E3/uL   Basophils Absolute 0.1 0.0 - 0.2 x10E3/uL   Immature Granulocytes 0 Not Estab. %   Immature Grans (Abs) 0.0 0.0 - 0.1 x10E3/uL  CMP14+EGFR     Status: Abnormal   Collection Time: 10/20/22 10:37 AM  Result Value Ref Range   Glucose 133 (H) 70 - 99 mg/dL   BUN 11 6 - 24 mg/dL   Creatinine, Ser 4.09 0.57 - 1.00 mg/dL   eGFR 811 >91 YN/WGN/5.62   BUN/Creatinine Ratio 16 9 - 23   Sodium 132 (L) 134 - 144 mmol/L   Potassium 4.7 3.5 - 5.2 mmol/L   Chloride 97 96 - 106 mmol/L   CO2 21 20 -  29 mmol/L   Calcium 9.6 8.7 - 10.2 mg/dL   Total Protein 7.4 6.0 - 8.5 g/dL   Albumin 4.4 3.9 - 4.9 g/dL   Globulin, Total 3.0 1.5 - 4.5 g/dL   Albumin/Globulin Ratio 1.5 1.2 - 2.2   Bilirubin Total <0.2 0.0 - 1.2 mg/dL   Alkaline Phosphatase 81 44 - 121 IU/L   AST 9 0 - 40 IU/L   ALT 8 0 - 32 IU/L  TSH     Status: None   Collection Time: 10/20/22 10:37 AM  Result Value Ref Range   TSH 1.160 0.450 - 4.500 uIU/mL  Hemoglobin A1c     Status: Abnormal   Collection Time: 10/20/22 10:37 AM  Result Value Ref Range   Hgb A1c MFr Bld 8.4 (H) 4.8 - 5.6 %    Comment:          Prediabetes: 5.7 - 6.4          Diabetes: >6.4          Glycemic control for adults with diabetes: <7.0    Est. average glucose Bld gHb Est-mCnc 194 mg/dL  Vitamin Z30     Status: None   Collection Time: 10/20/22 10:37 AM  Result Value Ref Range   Vitamin B-12 427 232 - 1,245 pg/mL       Assessment & Plan:   Problem List Items Addressed This Visit     Essential hypertension, benign - Primary   Relevant Medications   icosapent Ethyl (VASCEPA) 1 g capsule   Mixed hyperlipidemia   Relevant Medications   icosapent Ethyl (VASCEPA) 1 g capsule   Vitamin D deficiency, unspecified   Seasonal allergic rhinitis due to pollen   Type 2 diabetes mellitus with hyperglycemia    Return in about 2 weeks (around 11/15/2022) for F/U.   Total time spent: 30 minutes  Miki Kins, FNP  11/01/2022

## 2022-11-02 NOTE — Assessment & Plan Note (Signed)
Starting losartan for her BP.  Will check again when she returns.

## 2022-11-02 NOTE — Assessment & Plan Note (Signed)
Suggested pt. Pick up allegra instead of claritin.   Will follow up in 2 weeks.

## 2022-11-02 NOTE — Assessment & Plan Note (Signed)
Starting Vascepa capsules today.  Will add statin at next appointment.

## 2022-11-02 NOTE — Assessment & Plan Note (Signed)
Starting Ozempic today Patient has used insulin previously, so refreshed her memory to help use pen.

## 2022-11-15 ENCOUNTER — Ambulatory Visit: Payer: Medicaid Other | Admitting: Family

## 2022-11-15 ENCOUNTER — Encounter: Payer: Self-pay | Admitting: Family

## 2022-11-15 VITALS — BP 140/72 | HR 96 | Ht 62.0 in | Wt 161.4 lb

## 2022-11-15 DIAGNOSIS — E1165 Type 2 diabetes mellitus with hyperglycemia: Secondary | ICD-10-CM

## 2022-11-15 DIAGNOSIS — E559 Vitamin D deficiency, unspecified: Secondary | ICD-10-CM

## 2022-11-15 DIAGNOSIS — R799 Abnormal finding of blood chemistry, unspecified: Secondary | ICD-10-CM | POA: Diagnosis not present

## 2022-11-15 DIAGNOSIS — R8290 Unspecified abnormal findings in urine: Secondary | ICD-10-CM

## 2022-11-15 DIAGNOSIS — I10 Essential (primary) hypertension: Secondary | ICD-10-CM

## 2022-11-15 DIAGNOSIS — E782 Mixed hyperlipidemia: Secondary | ICD-10-CM | POA: Diagnosis not present

## 2022-11-15 DIAGNOSIS — F17210 Nicotine dependence, cigarettes, uncomplicated: Secondary | ICD-10-CM

## 2022-11-15 LAB — POCT URINALYSIS DIPSTICK
Bilirubin, UA: NEGATIVE
Glucose, UA: NEGATIVE
Ketones, UA: NEGATIVE
Nitrite, UA: NEGATIVE
Protein, UA: NEGATIVE
Spec Grav, UA: 1.015 (ref 1.010–1.025)
Urobilinogen, UA: 0.2 E.U./dL
pH, UA: 7 (ref 5.0–8.0)

## 2022-11-15 LAB — POC CREATINE & ALBUMIN,URINE
Albumin/Creatinine Ratio, Urine, POC: >300 — AB
Creatinine, POC: 10 mg/dL
Microalbumin Ur, POC: 30 mg/L

## 2022-11-15 NOTE — Progress Notes (Signed)
Established Patient Office Visit  Subjective:  Patient ID: Audrey Berry, female    DOB: February 21, 1976  Age: 47 y.o. MRN: [id number]  Chief Complaint  Patient presents with   Follow-up    2 week follow up    Patient is here today for her 2 week follow up.  She is doing well with the Ozempic, and has not had any side effects.   Need urine sample, WBC was elevated, need Microalbumin as well.     No other concerns at this time.   Past Medical History:  Diagnosis Date   Gestational diabetes    Polycystic disease, ovaries     Past Surgical History:  Procedure Laterality Date   CESAREAN SECTION  2001   at [facility]   CESAREAN SECTION  2013   [facility]   HEMORROIDECTOMY  2014   TUBAL LIGATION  2013   [facility]    Social History   Socioeconomic History   Marital status: Married    Spouse name: Not on file   Number of children: Not on file   Years of education: Not on file   Highest education level: Not on file  Occupational History   Not on file  Tobacco Use   Smoking status: Every Day    Packs/day: 1.00    Years: 15.00    Additional pack years: 0.00    Total pack years: 15.00    Types: Cigarettes   Smokeless tobacco: Never  Substance and Sexual Activity   Alcohol use: Not Currently   Drug use: Not Currently   Sexual activity: Yes    Partners: Male    Birth control/protection: Surgical    Comment: s/p tubal ligation, 2013  Other Topics Concern   Not on file  Social History Narrative   Not on file   Social Determinants of Health   Financial Resource Strain: Not on file  Food Insecurity: Not on file  Transportation Needs: Not on file  Physical Activity: Not on file  Stress: Not on file  Social Connections: Not on file  Intimate Partner Violence: Not on file    Family History  Problem Relation Age of Onset   Cancer Mother    Heart disease Father     Allergies  Allergen Reactions   Acetaminophen Hives    Review of Systems  All other systems reviewed and  are negative.      Objective:   BP (!) 140/72   Pulse 96   Ht 5\' 2"  (1.575 m)   Wt 161 lb 6.4 oz (73.2 kg)   SpO2 97%   BMI 29.52 kg/m   Vitals:   11/15/22 1350  BP: (!) 140/72  Pulse: 96  Height: 5\' 2"  (1.575 m)  Weight: 161 lb 6.4 oz (73.2 kg)  SpO2: 97%  BMI (Calculated): 29.51    Physical Exam Vitals and nursing note reviewed.  Constitutional:      Appearance: Normal appearance. She is normal weight.  HENT:     Head: Normocephalic.  Eyes:     Pupils: Pupils are equal, round, and reactive to light.  Cardiovascular:     Rate and Rhythm: Normal rate.  Pulmonary:     Effort: Pulmonary effort is normal.  Neurological:     Mental Status: She is alert.      Results for orders placed or performed in visit on 11/15/22  Microscopic Examination  Result Value Ref Range   WBC, UA >30 (A) 0 - 5 /hpf   RBC, Urine  None seen 0 - 2 /hpf   Epithelial Cells (non renal) None seen 0 - 10 /hpf   Casts None seen None seen /lpf   Bacteria, UA Few None seen/Few  Urinalysis, Routine w reflex microscopic  Result Value Ref Range   Specific Gravity, UA 1.005 1.005 - 1.030   pH, UA 7.0 5.0 - 7.5   Color, UA Yellow Yellow   Appearance Ur Clear Clear   Leukocytes,UA 3+ (A) Negative   Protein,UA Negative Negative/Trace   Glucose, UA Negative Negative   Ketones, UA Negative Negative   RBC, UA 1+ (A) Negative   Bilirubin, UA Negative Negative   Urobilinogen, Ur 0.2 0.2 - 1.0 mg/dL   Nitrite, UA Negative Negative   Microscopic Examination See below:   POC CREATINE & ALBUMIN,URINE  Result Value Ref Range   Microalbumin Ur, POC 30 mg/L   Creatinine, POC 10 mg/dL   Albumin/Creatinine Ratio, Urine, POC >300 (A)   POCT urinalysis dipstick  Result Value Ref Range   Color, UA Yellow    Clarity, UA Cloudy    Glucose, UA Negative Negative   Bilirubin, UA Negative    Ketones, UA Negative    Spec Grav, UA 1.015 1.010 - 1.025   Blood, UA Moderate    pH, UA 7.0 5.0 - 8.0   Protein,  UA Negative Negative   Urobilinogen, UA 0.2 0.2 or 1.0 E.U./dL   Nitrite, UA Negative    Leukocytes, UA Large (3+) (A) Negative   Appearance     Odor      Recent Results (from the past 2160 hour(s))  Lipid panel     Status: Abnormal   Collection Time: 10/20/22 10:37 AM  Result Value Ref Range   Cholesterol, Total 211 (H) 100 - 199 mg/dL   Triglycerides 657 (HH) 0 - 149 mg/dL   HDL 33 (L) >84 mg/dL   VLDL Cholesterol Cal 92 (H) 5 - 40 mg/dL   LDL Chol Calc (NIH) 86 0 - 99 mg/dL   Chol/HDL Ratio 6.4 (H) 0.0 - 4.4 ratio    Comment:                                   T. Chol/HDL Ratio                                             Men  Women                               1/2 Avg.Risk  3.4    3.3                                   Avg.Risk  5.0    4.4                                2X Avg.Risk  9.6    7.1                                3X Avg.Risk 23.4   11.0   VITAMIN  D 25 Hydroxy (Vit-D Deficiency, Fractures)     Status: Abnormal   Collection Time: 10/20/22 10:37 AM  Result Value Ref Range   Vit D, 25-Hydroxy 5.5 (L) 30.0 - 100.0 ng/mL    Comment: Vitamin D deficiency has been defined by the Institute of Medicine and an Endocrine Society practice guideline as a level of serum 25-OH vitamin D less than 20 ng/mL (1,2). The Endocrine Society went on to further define vitamin D insufficiency as a level between 21 and 29 ng/mL (2). 1. IOM (Institute of Medicine). 2010. Dietary reference    intakes for calcium and D. [place]: The    [employer]. 2. Holick MF, Binkley [place], Bischoff-Ferrari HA, et al.    Evaluation, treatment, and prevention of vitamin D    deficiency: an Endocrine Society clinical practice    guideline. JCEM. 2011 Jul; 96(7):1911-30.   CBC With Differential     Status: Abnormal   Collection Time: 10/20/22 10:37 AM  Result Value Ref Range   WBC 11.4 (H) 3.4 - 10.8 x10E3/uL   RBC 4.57 3.77 - 5.28 x10E6/uL   Hemoglobin 13.1 11.1 - 15.9 g/dL    Hematocrit 09.8 11.9 - 46.6 %   MCV 85 79 - 97 fL   MCH 28.7 26.6 - 33.0 pg   MCHC 33.7 31.5 - 35.7 g/dL   RDW 14.7 82.9 - 56.2 %   Neutrophils 66 Not Estab. %   Lymphs 25 Not Estab. %   Monocytes 6 Not Estab. %   Eos 2 Not Estab. %   Basos 1 Not Estab. %   Neutrophils Absolute 7.5 (H) 1.4 - 7.0 x10E3/uL   Lymphocytes Absolute 2.8 0.7 - 3.1 x10E3/uL   Monocytes Absolute 0.7 0.1 - 0.9 x10E3/uL   EOS (ABSOLUTE) 0.3 0.0 - 0.4 x10E3/uL   Basophils Absolute 0.1 0.0 - 0.2 x10E3/uL   Immature Granulocytes 0 Not Estab. %   Immature Grans (Abs) 0.0 0.0 - 0.1 x10E3/uL  CMP14+EGFR     Status: Abnormal   Collection Time: 10/20/22 10:37 AM  Result Value Ref Range   Glucose 133 (H) 70 - 99 mg/dL   BUN 11 6 - 24 mg/dL   Creatinine, Ser 1.30 0.57 - 1.00 mg/dL   eGFR 865 >78 IO/NGE/9.52   BUN/Creatinine Ratio 16 9 - 23   Sodium 132 (L) 134 - 144 mmol/L   Potassium 4.7 3.5 - 5.2 mmol/L   Chloride 97 96 - 106 mmol/L   CO2 21 20 - 29 mmol/L   Calcium 9.6 8.7 - 10.2 mg/dL   Total Protein 7.4 6.0 - 8.5 g/dL   Albumin 4.4 3.9 - 4.9 g/dL   Globulin, Total 3.0 1.5 - 4.5 g/dL   Albumin/Globulin Ratio 1.5 1.2 - 2.2   Bilirubin Total <0.2 0.0 - 1.2 mg/dL   Alkaline Phosphatase 81 44 - 121 IU/L   AST 9 0 - 40 IU/L   ALT 8 0 - 32 IU/L  TSH     Status: None   Collection Time: 10/20/22 10:37 AM  Result Value Ref Range   TSH 1.160 0.450 - 4.500 uIU/mL  Hemoglobin A1c     Status: Abnormal   Collection Time: 10/20/22 10:37 AM  Result Value Ref Range   Hgb A1c MFr Bld 8.4 (H) 4.8 - 5.6 %    Comment:          Prediabetes: 5.7 - 6.4          Diabetes: >6.4  Glycemic control for adults with diabetes: <7.0    Est. average glucose Bld gHb Est-mCnc 194 mg/dL  Vitamin Z61     Status: None   Collection Time: 10/20/22 10:37 AM  Result Value Ref Range   Vitamin B-12 427 232 - 1,245 pg/mL  POC CREATINE & ALBUMIN,URINE     Status: Abnormal   Collection Time: 11/15/22  2:21 PM  Result Value Ref Range    Microalbumin Ur, POC 30 mg/L   Creatinine, POC 10 mg/dL   Albumin/Creatinine Ratio, Urine, POC >300 (A)   POCT urinalysis dipstick     Status: Abnormal   Collection Time: 11/15/22  2:22 PM  Result Value Ref Range   Color, UA Yellow    Clarity, UA Cloudy    Glucose, UA Negative Negative   Bilirubin, UA Negative    Ketones, UA Negative    Spec Grav, UA 1.015 1.010 - 1.025   Blood, UA Moderate    pH, UA 7.0 5.0 - 8.0   Protein, UA Negative Negative   Urobilinogen, UA 0.2 0.2 or 1.0 E.U./dL   Nitrite, UA Negative    Leukocytes, UA Large (3+) (A) Negative   Appearance     Odor    Urinalysis, Routine w reflex microscopic     Status: Abnormal   Collection Time: 11/15/22  3:53 PM  Result Value Ref Range   Specific Gravity, UA 1.005 1.005 - 1.030   pH, UA 7.0 5.0 - 7.5   Color, UA Yellow Yellow   Appearance Ur Clear Clear   Leukocytes,UA 3+ (A) Negative   Protein,UA Negative Negative/Trace   Glucose, UA Negative Negative   Ketones, UA Negative Negative   RBC, UA 1+ (A) Negative   Bilirubin, UA Negative Negative   Urobilinogen, Ur 0.2 0.2 - 1.0 mg/dL   Nitrite, UA Negative Negative   Microscopic Examination See below:     Comment: Microscopic was indicated and was performed.  Microscopic Examination     Status: Abnormal   Collection Time: 11/15/22  3:53 PM  Result Value Ref Range   WBC, UA >30 (A) 0 - 5 /hpf   RBC, Urine None seen 0 - 2 /hpf   Epithelial Cells (non renal) None seen 0 - 10 /hpf   Casts None seen None seen /lpf   Bacteria, UA Few None seen/Few       Assessment & Plan:   Problem List Items Addressed This Visit       Active Problems   Essential hypertension, benign    Increasing Losartan to 100mg .  Patient advised to take 2 of her prescription she has now, then get new RX.   Will reassess at follow up.        Relevant Medications   losartan (COZAAR) 100 MG tablet   Mixed hyperlipidemia   Relevant Medications   losartan (COZAAR) 100 MG tablet    Vitamin D deficiency, unspecified   Type 2 diabetes mellitus with hyperglycemia (HCC) - Primary    Continue current diabetes POC, as patient has been well controlled on current regimen.  Will adjust meds if needed based on labs.        Relevant Medications   losartan (COZAAR) 100 MG tablet   Semaglutide, 1 MG/DOSE, 4 MG/3ML SOPN   Other Relevant Orders   POC CREATINE & ALBUMIN,URINE (Completed)   Tobacco dependence due to cigarettes   Other Visit Diagnoses     Abnormal blood chemistry       Relevant Orders  POCT urinalysis dipstick (Completed)   Abnormal finding in urine       Relevant Orders   Urinalysis, Routine w reflex microscopic (Completed)   Urine Culture       Return in about 2 weeks (around 11/29/2022) for F/U.   Total time spent: 30 minutes  Miki Kins, FNP  11/15/2022

## 2022-11-16 ENCOUNTER — Encounter: Payer: Self-pay | Admitting: Family

## 2022-11-16 LAB — URINALYSIS, ROUTINE W REFLEX MICROSCOPIC
Bilirubin, UA: NEGATIVE
Glucose, UA: NEGATIVE
Ketones, UA: NEGATIVE
Nitrite, UA: NEGATIVE
Protein,UA: NEGATIVE
Specific Gravity, UA: 1.005 (ref 1.005–1.030)
Urobilinogen, Ur: 0.2 mg/dL (ref 0.2–1.0)
pH, UA: 7 (ref 5.0–7.5)

## 2022-11-16 LAB — MICROSCOPIC EXAMINATION
Casts: NONE SEEN /lpf
Epithelial Cells (non renal): NONE SEEN /hpf (ref 0–10)
RBC, Urine: NONE SEEN /hpf (ref 0–2)
WBC, UA: >30 /hpf — AB (ref 0–5)

## 2022-11-17 ENCOUNTER — Encounter: Payer: Self-pay | Admitting: Family

## 2022-11-17 DIAGNOSIS — F17210 Nicotine dependence, cigarettes, uncomplicated: Secondary | ICD-10-CM | POA: Insufficient documentation

## 2022-11-17 LAB — URINE CULTURE

## 2022-11-17 MED ORDER — SEMAGLUTIDE (1 MG/DOSE) 4 MG/3ML ~~LOC~~ SOPN: 1.0000 mg | PEN_INJECTOR | SUBCUTANEOUS | 3 refills | Status: AC

## 2022-11-17 MED ORDER — LOSARTAN POTASSIUM 100 MG PO TABS: 100.0000 mg | ORAL_TABLET | Freq: Every day | ORAL | 1 refills | Status: DC

## 2022-11-17 NOTE — Assessment & Plan Note (Signed)
Continue current diabetes POC, as patient has been well controlled on current regimen.  Will adjust meds if needed based on labs.  

## 2022-11-17 NOTE — Assessment & Plan Note (Signed)
Increasing Losartan to 100mg .  Patient advised to take 2 of her prescription she has now, then get new RX.   Will reassess at follow up.

## 2022-11-26 ENCOUNTER — Encounter: Payer: Self-pay | Admitting: Family

## 2022-11-26 MED ORDER — NITROFURANTOIN MONOHYD MACRO 100 MG PO CAPS: 100.0000 mg | ORAL_CAPSULE | Freq: Two times a day (BID) | ORAL | 0 refills | Status: DC

## 2022-11-26 NOTE — Addendum Note (Signed)
Addended by: Grayling Congress on: 11/26/2022 10:25 AM   Modules accepted: Orders

## 2022-11-29 ENCOUNTER — Ambulatory Visit: Payer: Medicaid Other | Admitting: Family

## 2022-11-29 ENCOUNTER — Encounter: Payer: Self-pay | Admitting: Family

## 2022-11-29 VITALS — BP 142/70 | HR 97 | Ht 62.0 in | Wt 162.8 lb

## 2022-11-29 DIAGNOSIS — I10 Essential (primary) hypertension: Secondary | ICD-10-CM | POA: Diagnosis not present

## 2022-11-29 DIAGNOSIS — E1165 Type 2 diabetes mellitus with hyperglycemia: Secondary | ICD-10-CM

## 2022-11-29 MED ORDER — BLOOD PRESSURE CUFF MISC: 1.0000 | Freq: Every day | 0 refills | Status: AC

## 2022-11-29 NOTE — Progress Notes (Signed)
Established Patient Office Visit  Subjective:  Patient ID: Audrey Berry, female    DOB: 1976-05-22  Age: 47 y.o. MRN: [id number]  Chief Complaint  Patient presents with   Follow-up    2 week follow up    Patient is here for her 2 week follow up.  She has been feeling well since her last appointment.   Blood pressure is still elevated today, but she says that she has not had the meds for more than a few days.  She did not pick them up until Thursday.   She has 1 dose of her ozempic left, needs increased dose.   No other concerns at this time.   Past Medical History:  Diagnosis Date   Gestational diabetes    Polycystic disease, ovaries     Past Surgical History:  Procedure Laterality Date   CESAREAN SECTION  2001   at [facility]   CESAREAN SECTION  2013   [facility]   HEMORROIDECTOMY  2014   TUBAL LIGATION  2013   [facility]    Social History   Socioeconomic History   Marital status: Married    Spouse name: Not on file   Number of children: Not on file   Years of education: Not on file   Highest education level: Not on file  Occupational History   Not on file  Tobacco Use   Smoking status: Every Day    Packs/day: 1.00    Years: 15.00    Additional pack years: 0.00    Total pack years: 15.00    Types: Cigarettes   Smokeless tobacco: Never  Substance and Sexual Activity   Alcohol use: Not Currently   Drug use: Not Currently   Sexual activity: Yes    Partners: Male    Birth control/protection: Surgical    Comment: s/p tubal ligation, 2013  Other Topics Concern   Not on file  Social History Narrative   Not on file   Social Determinants of Health   Financial Resource Strain: Not on file  Food Insecurity: Not on file  Transportation Needs: Not on file  Physical Activity: Not on file  Stress: Not on file  Social Connections: Not on file  Intimate Partner Violence: Not on file    Family History  Problem Relation Age of Onset   Cancer Mother    Heart disease  Father     Allergies  Allergen Reactions   Acetaminophen Hives    Review of Systems  All other systems reviewed and are negative.      Objective:   BP (!) 142/70   Pulse 97   Ht 5\' 2"  (1.575 m)   Wt 162 lb 12.8 oz (73.8 kg)   SpO2 98%   BMI 29.78 kg/m   Vitals:   11/29/22 1413  BP: (!) 142/70  Pulse: 97  Height: 5\' 2"  (1.575 m)  Weight: 162 lb 12.8 oz (73.8 kg)  SpO2: 98%  BMI (Calculated): 29.77    Physical Exam Vitals and nursing note reviewed.  Constitutional:      Appearance: Normal appearance. She is normal weight.  HENT:     Head: Normocephalic.  Eyes:     Extraocular Movements: Extraocular movements intact.     Conjunctiva/sclera: Conjunctivae normal.     Pupils: Pupils are equal, round, and reactive to light.  Cardiovascular:     Rate and Rhythm: Normal rate and regular rhythm.  Pulmonary:     Effort: Pulmonary effort is normal.  Neurological:  General: No focal deficit present.     Mental Status: She is alert and oriented to person, place, and time. Mental status is at baseline.  Psychiatric:        Mood and Affect: Mood normal.        Behavior: Behavior normal.        Thought Content: Thought content normal.        Judgment: Judgment normal.      No results found for any visits on 11/29/22.  Recent Results (from the past 2160 hour(s))  Lipid panel     Status: Abnormal   Collection Time: 10/20/22 10:37 AM  Result Value Ref Range   Cholesterol, Total 211 (H) 100 - 199 mg/dL   Triglycerides 161 (HH) 0 - 149 mg/dL   HDL 33 (L) >09 mg/dL   VLDL Cholesterol Cal 92 (H) 5 - 40 mg/dL   LDL Chol Calc (NIH) 86 0 - 99 mg/dL   Chol/HDL Ratio 6.4 (H) 0.0 - 4.4 ratio    Comment:                                   T. Chol/HDL Ratio                                             Men  Women                               1/2 Avg.Risk  3.4    3.3                                   Avg.Risk  5.0    4.4                                2X Avg.Risk  9.6     7.1                                3X Avg.Risk 23.4   11.0   VITAMIN D 25 Hydroxy (Vit-D Deficiency, Fractures)     Status: Abnormal   Collection Time: 10/20/22 10:37 AM  Result Value Ref Range   Vit D, 25-Hydroxy 5.5 (L) 30.0 - 100.0 ng/mL    Comment: Vitamin D deficiency has been defined by the Institute of Medicine and an Endocrine Society practice guideline as a level of serum 25-OH vitamin D less than 20 ng/mL (1,2). The Endocrine Society went on to further define vitamin D insufficiency as a level between 21 and 29 ng/mL (2). 1. IOM (Institute of Medicine). 2010. Dietary reference    intakes for calcium and D. [place]: The    [employer]. 2. Holick MF, [place], Bischoff-Ferrari HA, et al.    Evaluation, treatment, and prevention of vitamin D    deficiency: an Endocrine Society clinical practice    guideline. JCEM. 2011 Jul; 96(7):1911-30.   CBC With Differential     Status: Abnormal   Collection Time: 10/20/22 10:37 AM  Result Value Ref Range   WBC 11.4 (H) 3.4 - 10.8  x10E3/uL   RBC 4.57 3.77 - 5.28 x10E6/uL   Hemoglobin 13.1 11.1 - 15.9 g/dL   Hematocrit 16.1 09.6 - 46.6 %   MCV 85 79 - 97 fL   MCH 28.7 26.6 - 33.0 pg   MCHC 33.7 31.5 - 35.7 g/dL   RDW 04.5 40.9 - 81.1 %   Neutrophils 66 Not Estab. %   Lymphs 25 Not Estab. %   Monocytes 6 Not Estab. %   Eos 2 Not Estab. %   Basos 1 Not Estab. %   Neutrophils Absolute 7.5 (H) 1.4 - 7.0 x10E3/uL   Lymphocytes Absolute 2.8 0.7 - 3.1 x10E3/uL   Monocytes Absolute 0.7 0.1 - 0.9 x10E3/uL   EOS (ABSOLUTE) 0.3 0.0 - 0.4 x10E3/uL   Basophils Absolute 0.1 0.0 - 0.2 x10E3/uL   Immature Granulocytes 0 Not Estab. %   Immature Grans (Abs) 0.0 0.0 - 0.1 x10E3/uL  CMP14+EGFR     Status: Abnormal   Collection Time: 10/20/22 10:37 AM  Result Value Ref Range   Glucose 133 (H) 70 - 99 mg/dL   BUN 11 6 - 24 mg/dL   Creatinine, Ser 9.14 0.57 - 1.00 mg/dL   eGFR 782 >95 AO/ZHY/8.65   BUN/Creatinine Ratio 16 9 -  23   Sodium 132 (L) 134 - 144 mmol/L   Potassium 4.7 3.5 - 5.2 mmol/L   Chloride 97 96 - 106 mmol/L   CO2 21 20 - 29 mmol/L   Calcium 9.6 8.7 - 10.2 mg/dL   Total Protein 7.4 6.0 - 8.5 g/dL   Albumin 4.4 3.9 - 4.9 g/dL   Globulin, Total 3.0 1.5 - 4.5 g/dL   Albumin/Globulin Ratio 1.5 1.2 - 2.2   Bilirubin Total <0.2 0.0 - 1.2 mg/dL   Alkaline Phosphatase 81 44 - 121 IU/L   AST 9 0 - 40 IU/L   ALT 8 0 - 32 IU/L  TSH     Status: None   Collection Time: 10/20/22 10:37 AM  Result Value Ref Range   TSH 1.160 0.450 - 4.500 uIU/mL  Hemoglobin A1c     Status: Abnormal   Collection Time: 10/20/22 10:37 AM  Result Value Ref Range   Hgb A1c MFr Bld 8.4 (H) 4.8 - 5.6 %    Comment:          Prediabetes: 5.7 - 6.4          Diabetes: >6.4          Glycemic control for adults with diabetes: <7.0    Est. average glucose Bld gHb Est-mCnc 194 mg/dL  Vitamin H84     Status: None   Collection Time: 10/20/22 10:37 AM  Result Value Ref Range   Vitamin B-12 427 232 - 1,245 pg/mL  POC CREATINE & ALBUMIN,URINE     Status: Abnormal   Collection Time: 11/15/22  2:21 PM  Result Value Ref Range   Microalbumin Ur, POC 30 mg/L   Creatinine, POC 10 mg/dL   Albumin/Creatinine Ratio, Urine, POC >300 (A)   POCT urinalysis dipstick     Status: Abnormal   Collection Time: 11/15/22  2:22 PM  Result Value Ref Range   Color, UA Yellow    Clarity, UA Cloudy    Glucose, UA Negative Negative   Bilirubin, UA Negative    Ketones, UA Negative    Spec Grav, UA 1.015 1.010 - 1.025   Blood, UA Moderate    pH, UA 7.0 5.0 - 8.0   Protein, UA Negative Negative  Urobilinogen, UA 0.2 0.2 or 1.0 E.U./dL   Nitrite, UA Negative    Leukocytes, UA Large (3+) (A) Negative   Appearance     Odor    Urine Culture     Status: Abnormal   Collection Time: 11/15/22  3:45 PM   Specimen: Urine, Clean Catch   UC  Result Value Ref Range   Urine Culture, Routine Final report (A)    Organism ID, Bacteria Escherichia coli (A)      Comment: Cefazolin <=4 ug/mL Cefazolin with an MIC <=16 predicts susceptibility to the oral agents cefaclor, cefdinir, cefpodoxime, cefprozil, cefuroxime, cephalexin, and loracarbef when used for therapy of uncomplicated urinary tract infections due to E. coli, Klebsiella pneumoniae, and Proteus mirabilis. Greater than 100,000 colony forming units per mL    Antimicrobial Susceptibility Comment     Comment:       ** S = Susceptible; I = Intermediate; R = Resistant **                    P = Positive; N = Negative             MICS are expressed in micrograms per mL    Antibiotic                 RSLT#1    RSLT#2    RSLT#3    RSLT#4 Amoxicillin/Clavulanic Acid    S Ampicillin                     S Cefepime                       S Ceftriaxone                    S Cefuroxime                     S Ciprofloxacin                  S Ertapenem                      S Gentamicin                     S Imipenem                       S Levofloxacin                   S Meropenem                      S Nitrofurantoin                 S Piperacillin/Tazobactam        S Tetracycline                   S Tobramycin                     S Trimethoprim/Sulfa             S   Urinalysis, Routine w reflex microscopic     Status: Abnormal   Collection Time: 11/15/22  3:53 PM  Result Value Ref Range   Specific Gravity, UA 1.005 1.005 - 1.030   pH, UA 7.0 5.0 - 7.5   Color, UA Yellow Yellow   Appearance Ur  Clear Clear   Leukocytes,UA 3+ (A) Negative   Protein,UA Negative Negative/Trace   Glucose, UA Negative Negative   Ketones, UA Negative Negative   RBC, UA 1+ (A) Negative   Bilirubin, UA Negative Negative   Urobilinogen, Ur 0.2 0.2 - 1.0 mg/dL   Nitrite, UA Negative Negative   Microscopic Examination See below:     Comment: Microscopic was indicated and was performed.  Microscopic Examination     Status: Abnormal   Collection Time: 11/15/22  3:53 PM  Result Value Ref Range   WBC, UA >30 (A) 0 - 5  /hpf   RBC, Urine None seen 0 - 2 /hpf   Epithelial Cells (non renal) None seen 0 - 10 /hpf   Casts None seen None seen /lpf   Bacteria, UA Few None seen/Few       Assessment & Plan:   Problem List Items Addressed This Visit       Active Problems   Essential hypertension, benign - Primary    Patient will get a blood pressure cuff so that she can check her pressure at home.   Will continue new med Come back in 2 weeks with readings and we will see if we need to add HCTZ at that time.       Type 2 diabetes mellitus with hyperglycemia (HCC)    Continue current diabetes POC, as patient has been well controlled on current regimen.  Instructed pt. To talk to pharmacy about her increased ozempic, as it was sent on 5/8.  Will adjust meds if needed based on labs.         Return in about 2 weeks (around 12/13/2022) for F/U.   Total time spent: 20 minutes  Miki Kins, FNP  11/29/2022   This document may have been prepared by [facility] Voice Recognition software and as such may include unintentional dictation errors.

## 2022-11-29 NOTE — Assessment & Plan Note (Signed)
Continue current diabetes POC, as patient has been well controlled on current regimen.  Instructed pt. To talk to pharmacy about her increased ozempic, as it was sent on 5/8.  Will adjust meds if needed based on labs.

## 2022-11-29 NOTE — Assessment & Plan Note (Signed)
Patient will get a blood pressure cuff so that she can check her pressure at home.   Will continue new med Come back in 2 weeks with readings and we will see if we need to add HCTZ at that time.

## 2022-12-13 ENCOUNTER — Ambulatory Visit: Payer: Medicaid Other | Admitting: Family

## 2022-12-13 ENCOUNTER — Encounter: Payer: Self-pay | Admitting: Family

## 2022-12-13 VITALS — BP 138/70 | HR 91 | Ht 62.0 in | Wt 162.0 lb

## 2022-12-13 DIAGNOSIS — I10 Essential (primary) hypertension: Secondary | ICD-10-CM

## 2022-12-13 DIAGNOSIS — E1165 Type 2 diabetes mellitus with hyperglycemia: Secondary | ICD-10-CM | POA: Diagnosis not present

## 2022-12-15 ENCOUNTER — Encounter: Payer: Self-pay | Admitting: Family

## 2022-12-15 MED ORDER — LOSARTAN POTASSIUM 100 MG PO TABS: 100.0000 mg | ORAL_TABLET | Freq: Every day | ORAL | 1 refills | Status: AC

## 2022-12-15 NOTE — Assessment & Plan Note (Signed)
Continue current diabetes POC.  Will adjust meds if needed based on labs.

## 2022-12-15 NOTE — Progress Notes (Signed)
Established Patient Office Visit  Subjective:  Patient ID: Audrey Berry, female    DOB: 1976-06-26  Age: 47 y.o. MRN: [id number]  Chief Complaint  Patient presents with   Follow-up    2 week follow up    Pt. Here for her 2 week follow up.  Her blood pressure is better today, she says that is has been much better at home, highest has been in the 130s/70s.   Doing well otherwise, no side effects.       No other concerns at this time.   Past Medical History:  Diagnosis Date   Gestational diabetes    Polycystic disease, ovaries     Past Surgical History:  Procedure Laterality Date   [facility]AREAN SECTION  2001   at [facility]   CESAREAN SECTION  2013   [facility]   HEMORROIDECTOMY  2014   TUBAL LIGATION  2013   [facility]    Social History   Socioeconomic History   Marital status: Married    Spouse name: Not on file   Number of children: Not on file   Years of education: Not on file   Highest education level: Not on file  Occupational History   Not on file  Tobacco Use   Smoking status: Every Day    Packs/day: 1.00    Years: 15.00    Additional pack years: 0.00    Total pack years: 15.00    Types: Cigarettes   Smokeless tobacco: Never  Substance and Sexual Activity   Alcohol use: Not Currently   Drug use: Not Currently   Sexual activity: Yes    Partners: Male    Birth control/protection: Surgical    Comment: s/p tubal ligation, 2013  Other Topics Concern   Not on file  Social History Narrative   Not on file   Social Determinants of Health   Financial Resource Strain: Not on file  Food Insecurity: Not on file  Transportation Needs: Not on file  Physical Activity: Not on file  Stress: Not on file  Social Connections: Not on file  Intimate Partner Violence: Not on file    Family History  Problem Relation Age of Onset   Cancer Mother    Heart disease Father     Allergies  Allergen Reactions   Acetaminophen Hives    Review of Systems  All other systems  reviewed and are negative.      Objective:   BP 138/70   Pulse 91   Ht 5\' 2"  (1.575 m)   Wt 162 lb (73.5 kg)   SpO2 98%   BMI 29.63 kg/m   Vitals:   12/13/22 1346  BP: 138/70  Pulse: 91  Height: 5\' 2"  (1.575 m)  Weight: 162 lb (73.5 kg)  SpO2: 98%  BMI (Calculated): 29.62    Physical Exam Vitals and nursing note reviewed.  Constitutional:      Appearance: Normal appearance. She is normal weight.  HENT:     Head: Normocephalic.  Eyes:     Extraocular Movements: Extraocular movements intact.     Conjunctiva/sclera: Conjunctivae normal.     Pupils: Pupils are equal, round, and reactive to light.  Cardiovascular:     Rate and Rhythm: Normal rate.  Pulmonary:     Effort: Pulmonary effort is normal.  Neurological:     General: No focal deficit present.     Mental Status: She is alert and oriented to person, place, and time. Mental status is at baseline.  Psychiatric:  Mood and Affect: Mood normal.        Behavior: Behavior normal.        Thought Content: Thought content normal.        Judgment: Judgment normal.      No results found for any visits on 12/13/22.  Recent Results (from the past 2160 hour(s))  Lipid panel     Status: Abnormal   Collection Time: 10/20/22 10:37 AM  Result Value Ref Range   Cholesterol, Total 211 (H) 100 - 199 mg/dL   Triglycerides 161 (HH) 0 - 149 mg/dL   HDL 33 (L) >09 mg/dL   VLDL Cholesterol Cal 92 (H) 5 - 40 mg/dL   LDL Chol Calc (NIH) 86 0 - 99 mg/dL   Chol/HDL Ratio 6.4 (H) 0.0 - 4.4 ratio    Comment:                                   T. Chol/HDL Ratio                                             Men  Women                               1/2 Avg.Risk  3.4    3.3                                   Avg.Risk  5.0    4.4                                2X Avg.Risk  9.6    7.1                                3X Avg.Risk 23.4   11.0   VITAMIN D 25 Hydroxy (Vit-D Deficiency, Fractures)     Status: Abnormal   Collection Time:  10/20/22 10:37 AM  Result Value Ref Range   Vit D, 25-Hydroxy 5.5 (L) 30.0 - 100.0 ng/mL    Comment: Vitamin D deficiency has been defined by the Institute of Medicine and an Endocrine Society practice guideline as a level of serum 25-OH vitamin D less than 20 ng/mL (1,2). The Endocrine Society went on to further define vitamin D insufficiency as a level between 21 and 29 ng/mL (2). 1. IOM (Institute of Medicine). 2010. Dietary reference    intakes for calcium and D. [place]: The    [employer]. 2. Holick MF, [place], Bischoff-Ferrari HA, et al.    Evaluation, treatment, and prevention of vitamin D    deficiency: an Endocrine Society clinical practice    guideline. JCEM. 2011 Jul; 96(7):1911-30.   CBC With Differential     Status: Abnormal   Collection Time: 10/20/22 10:37 AM  Result Value Ref Range   WBC 11.4 (H) 3.4 - 10.8 x10E3/uL   RBC 4.57 3.77 - 5.28 x10E6/uL   Hemoglobin 13.1 11.1 - 15.9 g/dL   Hematocrit 60.4 54.0 - 46.6 %   MCV 85 79 - 97 fL  MCH 28.7 26.6 - 33.0 pg   MCHC 33.7 31.5 - 35.7 g/dL   RDW 47.8 29.5 - 62.1 %   Neutrophils 66 Not Estab. %   Lymphs 25 Not Estab. %   Monocytes 6 Not Estab. %   Eos 2 Not Estab. %   Basos 1 Not Estab. %   Neutrophils Absolute 7.5 (H) 1.4 - 7.0 x10E3/uL   Lymphocytes Absolute 2.8 0.7 - 3.1 x10E3/uL   Monocytes Absolute 0.7 0.1 - 0.9 x10E3/uL   EOS (ABSOLUTE) 0.3 0.0 - 0.4 x10E3/uL   Basophils Absolute 0.1 0.0 - 0.2 x10E3/uL   Immature Granulocytes 0 Not Estab. %   Immature Grans (Abs) 0.0 0.0 - 0.1 x10E3/uL  CMP14+EGFR     Status: Abnormal   Collection Time: 10/20/22 10:37 AM  Result Value Ref Range   Glucose 133 (H) 70 - 99 mg/dL   BUN 11 6 - 24 mg/dL   Creatinine, Ser 3.08 0.57 - 1.00 mg/dL   eGFR 657 >84 ON/GEX/5.28   BUN/Creatinine Ratio 16 9 - 23   Sodium 132 (L) 134 - 144 mmol/L   Potassium 4.7 3.5 - 5.2 mmol/L   Chloride 97 96 - 106 mmol/L   CO2 21 20 - 29 mmol/L   Calcium 9.6 8.7 - 10.2  mg/dL   Total Protein 7.4 6.0 - 8.5 g/dL   Albumin 4.4 3.9 - 4.9 g/dL   Globulin, Total 3.0 1.5 - 4.5 g/dL   Albumin/Globulin Ratio 1.5 1.2 - 2.2   Bilirubin Total <0.2 0.0 - 1.2 mg/dL   Alkaline Phosphatase 81 44 - 121 IU/L   AST 9 0 - 40 IU/L   ALT 8 0 - 32 IU/L  TSH     Status: None   Collection Time: 10/20/22 10:37 AM  Result Value Ref Range   TSH 1.160 0.450 - 4.500 uIU/mL  Hemoglobin A1c     Status: Abnormal   Collection Time: 10/20/22 10:37 AM  Result Value Ref Range   Hgb A1c MFr Bld 8.4 (H) 4.8 - 5.6 %    Comment:          Prediabetes: 5.7 - 6.4          Diabetes: >6.4          Glycemic control for adults with diabetes: <7.0    Est. average glucose Bld gHb Est-mCnc 194 mg/dL  Vitamin U13     Status: None   Collection Time: 10/20/22 10:37 AM  Result Value Ref Range   Vitamin B-12 427 232 - 1,245 pg/mL  POC CREATINE & ALBUMIN,URINE     Status: Abnormal   Collection Time: 11/15/22  2:21 PM  Result Value Ref Range   Microalbumin Ur, POC 30 mg/L   Creatinine, POC 10 mg/dL   Albumin/Creatinine Ratio, Urine, POC >300 (A)   POCT urinalysis dipstick     Status: Abnormal   Collection Time: 11/15/22  2:22 PM  Result Value Ref Range   Color, UA Yellow    Clarity, UA Cloudy    Glucose, UA Negative Negative   Bilirubin, UA Negative    Ketones, UA Negative    Spec Grav, UA 1.015 1.010 - 1.025   Blood, UA Moderate    pH, UA 7.0 5.0 - 8.0   Protein, UA Negative Negative   Urobilinogen, UA 0.2 0.2 or 1.0 E.U./dL   Nitrite, UA Negative    Leukocytes, UA Large (3+) (A) Negative   Appearance     Odor    Urine  Culture     Status: Abnormal   Collection Time: 11/15/22  3:45 PM   Specimen: Urine, Clean Catch   UC  Result Value Ref Range   Urine Culture, Routine Final report (A)    Organism ID, Bacteria Escherichia coli (A)     Comment: Cefazolin <=4 ug/mL Cefazolin with an MIC <=16 predicts susceptibility to the oral agents cefaclor, cefdinir, cefpodoxime, cefprozil,  cefuroxime, cephalexin, and loracarbef when used for therapy of uncomplicated urinary tract infections due to E. coli, Klebsiella pneumoniae, and Proteus mirabilis. Greater than 100,000 colony forming units per mL    Antimicrobial Susceptibility Comment     Comment:       ** S = Susceptible; I = Intermediate; R = Resistant **                    P = Positive; N = Negative             MICS are expressed in micrograms per mL    Antibiotic                 RSLT#1    RSLT#2    RSLT#3    RSLT#4 Amoxicillin/Clavulanic Acid    S Ampicillin                     S Cefepime                       S Ceftriaxone                    S Cefuroxime                     S Ciprofloxacin                  S Ertapenem                      S Gentamicin                     S Imipenem                       S Levofloxacin                   S Meropenem                      S Nitrofurantoin                 S Piperacillin/Tazobactam        S Tetracycline                   S Tobramycin                     S Trimethoprim/Sulfa             S   Urinalysis, Routine w reflex microscopic     Status: Abnormal   Collection Time: 11/15/22  3:53 PM  Result Value Ref Range   Specific Gravity, UA 1.005 1.005 - 1.030   pH, UA 7.0 5.0 - 7.5   Color, UA Yellow Yellow   Appearance Ur Clear Clear   Leukocytes,UA 3+ (A) Negative   Protein,UA Negative Negative/Trace   Glucose, UA Negative Negative   Ketones, UA Negative Negative   RBC, UA 1+ (A) Negative  Bilirubin, UA Negative Negative   Urobilinogen, Ur 0.2 0.2 - 1.0 mg/dL   Nitrite, UA Negative Negative   Microscopic Examination See below:     Comment: Microscopic was indicated and was performed.  Microscopic Examination     Status: Abnormal   Collection Time: 11/15/22  3:53 PM  Result Value Ref Range   WBC, UA >30 (A) 0 - 5 /hpf   RBC, Urine None seen 0 - 2 /hpf   Epithelial Cells (non renal) None seen 0 - 10 /hpf   Casts None seen None seen /lpf   Bacteria, UA  Few None seen/Few       Assessment & Plan:   Problem List Items Addressed This Visit   None   No follow-ups on file.   Total time spent: 20 minutes  Miki Kins, FNP  12/13/2022   This document may have been prepared by [facility] Voice Recognition software and as such may include unintentional dictation errors.

## 2022-12-15 NOTE — Assessment & Plan Note (Signed)
Blood pressure well controlled with current medications.  Continue current therapy.  Will reassess at follow up.  

## 2023-01-12 ENCOUNTER — Encounter: Payer: Self-pay | Admitting: Family

## 2023-02-11 ENCOUNTER — Ambulatory Visit: Payer: Medicaid Other | Admitting: Family

## 2023-02-11 ENCOUNTER — Encounter: Payer: Self-pay | Admitting: Family

## 2023-02-11 VITALS — BP 160/98 | HR 91 | Ht 62.0 in | Wt 162.2 lb

## 2023-02-11 DIAGNOSIS — E538 Deficiency of other specified B group vitamins: Secondary | ICD-10-CM

## 2023-02-11 DIAGNOSIS — E782 Mixed hyperlipidemia: Secondary | ICD-10-CM

## 2023-02-11 DIAGNOSIS — E1165 Type 2 diabetes mellitus with hyperglycemia: Secondary | ICD-10-CM

## 2023-02-11 DIAGNOSIS — I10 Essential (primary) hypertension: Secondary | ICD-10-CM

## 2023-02-11 DIAGNOSIS — E559 Vitamin D deficiency, unspecified: Secondary | ICD-10-CM

## 2023-02-11 MED ORDER — METFORMIN HCL 500 MG PO TABS: 500.0000 mg | ORAL_TABLET | Freq: Two times a day (BID) | ORAL | 0 refills | Status: AC

## 2023-02-11 MED ORDER — HYDROCHLOROTHIAZIDE 25 MG PO TABS: 25.0000 mg | ORAL_TABLET | Freq: Every day | ORAL | 1 refills | Status: AC

## 2023-02-11 NOTE — Progress Notes (Unsigned)
Established Patient Office Visit  Subjective:  Patient ID: Audrey Berry, female    DOB: Aug 20, 1975  Age: 47 y.o. MRN: [id number]  Chief Complaint  Patient presents with  . Follow-up    2 mo f/u    HPI  No other concerns at this time.   Past Medical History:  Diagnosis Date  . Gestational diabetes   . Polycystic disease, ovaries     Past Surgical History:  Procedure Laterality Date  . CESAREAN SECTION  2001   at [facility]  . CESAREAN SECTION  2013   [facility]  . HEMORROIDECTOMY  2014  . TUBAL LIGATION  2013   [facility]    Social History   Socioeconomic History  . Marital status: Married    Spouse name: Not on file  . Number of children: Not on file  . Years of education: Not on file  . Highest education level: Not on file  Occupational History  . Not on file  Tobacco Use  . Smoking status: Every Day    Current packs/day: 1.00    Average packs/day: 1 pack/day for 15.0 years (15.0 ttl pk-yrs)    Types: Cigarettes  . Smokeless tobacco: Never  Substance and Sexual Activity  . Alcohol use: Not Currently  . Drug use: Not Currently  . Sexual activity: Yes    Partners: Male    Birth control/protection: Surgical    Comment: s/p tubal ligation, 2013  Other Topics Concern  . Not on file  Social History Narrative  . Not on file   Social Determinants of Health   Financial Resource Strain: Not on file  Food Insecurity: Not on file  Transportation Needs: Not on file  Physical Activity: Not on file  Stress: Not on file  Social Connections: Not on file  Intimate Partner Violence: Not on file    Family History  Problem Relation Age of Onset  . Cancer Mother   . Heart disease Father     Allergies  Allergen Reactions  . Acetaminophen Hives    ROS     Objective:   BP (!) 160/98   Pulse 91   Ht 5\' 2"  (1.575 m)   Wt 162 lb 3.2 oz (73.6 kg)   SpO2 98%   BMI 29.67 kg/m   Vitals:   02/11/23 1335  BP: (!) 160/98  Pulse: 91  Height: 5\' 2"  (1.575 m)  Weight:  162 lb 3.2 oz (73.6 kg)  SpO2: 98%  BMI (Calculated): 29.66    Physical Exam   No results found for any visits on 02/11/23.  Recent Results (from the past 2160 hour(s))  POC CREATINE & ALBUMIN,URINE     Status: Abnormal   Collection Time: 11/15/22  2:21 PM  Result Value Ref Range   Microalbumin Ur, POC 30 mg/L   Creatinine, POC 10 mg/dL   Albumin/Creatinine Ratio, Urine, POC >300 (A)   POCT urinalysis dipstick     Status: Abnormal   Collection Time: 11/15/22  2:22 PM  Result Value Ref Range   Color, UA Yellow    Clarity, UA Cloudy    Glucose, UA Negative Negative   Bilirubin, UA Negative    Ketones, UA Negative    Spec Grav, UA 1.015 1.010 - 1.025   Blood, UA Moderate    pH, UA 7.0 5.0 - 8.0   Protein, UA Negative Negative   Urobilinogen, UA 0.2 0.2 or 1.0 E.U./dL   Nitrite, UA Negative    Leukocytes, UA Large (3+) (A)  Negative   Appearance     Odor    Urine Culture     Status: Abnormal   Collection Time: 11/15/22  3:45 PM   Specimen: Urine, Clean Catch   UC  Result Value Ref Range   Urine Culture, Routine Final report (A)    Organism ID, Bacteria Escherichia coli (A)     Comment: Cefazolin <=4 ug/mL Cefazolin with an MIC <=16 predicts susceptibility to the oral agents cefaclor, cefdinir, cefpodoxime, cefprozil, cefuroxime, cephalexin, and loracarbef when used for therapy of uncomplicated urinary tract infections due to E. coli, Klebsiella pneumoniae, and Proteus mirabilis. Greater than 100,000 colony forming units per mL    Antimicrobial Susceptibility Comment     Comment:       ** S = Susceptible; I = Intermediate; R = Resistant **                    P = Positive; N = Negative             MICS are expressed in micrograms per mL    Antibiotic                 RSLT#1    RSLT#2    RSLT#3    RSLT#4 Amoxicillin/Clavulanic Acid    S Ampicillin                     S Cefepime                       S Ceftriaxone                    S Cefuroxime                      S Ciprofloxacin                  S Ertapenem                      S Gentamicin                     S Imipenem                       S Levofloxacin                   S Meropenem                      S Nitrofurantoin                 S Piperacillin/Tazobactam        S Tetracycline                   S Tobramycin                     S Trimethoprim/Sulfa             S   Urinalysis, Routine w reflex microscopic     Status: Abnormal   Collection Time: 11/15/22  3:53 PM  Result Value Ref Range   Specific Gravity, UA 1.005 1.005 - 1.030   pH, UA 7.0 5.0 - 7.5   Color, UA Yellow Yellow   Appearance Ur Clear Clear   Leukocytes,UA 3+ (A) Negative   Protein,UA Negative Negative/Trace   Glucose, UA Negative Negative  Ketones, UA Negative Negative   RBC, UA 1+ (A) Negative   Bilirubin, UA Negative Negative   Urobilinogen, Ur 0.2 0.2 - 1.0 mg/dL   Nitrite, UA Negative Negative   Microscopic Examination See below:     Comment: Microscopic was indicated and was performed.  Microscopic Examination     Status: Abnormal   Collection Time: 11/15/22  3:53 PM  Result Value Ref Range   WBC, UA >30 (A) 0 - 5 /hpf   RBC, Urine None seen 0 - 2 /hpf   Epithelial Cells (non renal) None seen 0 - 10 /hpf   Casts None seen None seen /lpf   Bacteria, UA Few None seen/Few       Assessment & Plan:   Problem List Items Addressed This Visit       Active Problems   Type 2 diabetes mellitus with hyperglycemia (HCC)   Relevant Medications   metFORMIN (GLUCOPHAGE) 500 MG tablet    No follow-ups on file.   Total time spent: {AMA time spent:29001} minutes  Miki Kins, FNP  02/11/2023   This document may have been prepared by [facility] Voice Recognition software and as such may include unintentional dictation errors.

## 2023-02-13 ENCOUNTER — Encounter: Payer: Self-pay | Admitting: Family

## 2023-02-13 NOTE — Assessment & Plan Note (Signed)
Continue current therapy for lipid control. Will modify as needed based on labwork results.   

## 2023-02-13 NOTE — Assessment & Plan Note (Signed)
Checking labs today.  Will continue supplements as needed.  

## 2023-02-13 NOTE — Assessment & Plan Note (Signed)
Adding hydrochlorothiazide To patient's blood pressure meds She will continue to monitor at home, and we will follow up to

## 2023-02-13 NOTE — Assessment & Plan Note (Signed)
Adding metformin since her insurance will not cover the GLP's despite her having tried this before and not been well controlled.  Will check A1C at follow up.

## 2023-02-14 NOTE — Telephone Encounter (Signed)
Spoke with patient at visit

## 2023-02-25 ENCOUNTER — Encounter: Payer: Self-pay | Admitting: Family

## 2023-02-25 ENCOUNTER — Ambulatory Visit: Payer: Medicaid Other | Admitting: Family

## 2023-02-25 VITALS — BP 140/70 | HR 95 | Ht 62.0 in | Wt 160.6 lb

## 2023-02-25 DIAGNOSIS — E1165 Type 2 diabetes mellitus with hyperglycemia: Secondary | ICD-10-CM

## 2023-02-25 DIAGNOSIS — E782 Mixed hyperlipidemia: Secondary | ICD-10-CM | POA: Diagnosis not present

## 2023-02-25 DIAGNOSIS — I10 Essential (primary) hypertension: Secondary | ICD-10-CM

## 2023-02-25 LAB — POCT CBG (FASTING - GLUCOSE)-MANUAL ENTRY: Glucose Fasting, POC: 252 mg/dL — AB (ref 70–99)

## 2023-03-14 NOTE — Assessment & Plan Note (Signed)
Continue current therapy for lipid control. Will modify as needed based on labwork results.   

## 2023-03-14 NOTE — Assessment & Plan Note (Signed)
Continue current diabetes POC, as patient has been well controlled on current regimen.  Will adjust meds if needed based on labs.  

## 2023-03-14 NOTE — Assessment & Plan Note (Signed)
Blood pressure well controlled with current medications.  Continue current therapy.  Will reassess at follow up.  

## 2023-03-14 NOTE — Progress Notes (Signed)
Established Patient Office Visit  Subjective:  Patient ID: Audrey Berry, female    DOB: 03-10-1976  Age: 47 y.o. MRN: [id number]  Chief Complaint  Patient presents with   Follow-up    Patient here for follow up, as her blood pressure is still elevated.  She has been taking it at home and it has still been elevated.   She is doing better, but it has been still higher than we would like.  She has also been having trouble getting her meds.    No other concerns at this time.   Past Medical History:  Diagnosis Date   Gestational diabetes    Polycystic disease, ovaries     Past Surgical History:  Procedure Laterality Date   CESAREAN SECTION  2001   at [facility]   CESAREAN SECTION  2013   [facility]   HEMORROIDECTOMY  2014   TUBAL LIGATION  2013   [facility]    Social History   Socioeconomic History   Marital status: Married    Spouse name: Not on file   Number of children: Not on file   Years of education: Not on file   Highest education level: Not on file  Occupational History   Not on file  Tobacco Use   Smoking status: Every Day    Current packs/day: 1.00    Average packs/day: 1 pack/day for 15.0 years (15.0 ttl pk-yrs)    Types: Cigarettes   Smokeless tobacco: Never  Substance and Sexual Activity   Alcohol use: Not Currently   Drug use: Not Currently   Sexual activity: Yes    Partners: Male    Birth control/protection: Surgical    Comment: s/p tubal ligation, 2013  Other Topics Concern   Not on file  Social History Narrative   Not on file   Social Determinants of Health   Financial Resource Strain: Not on file  Food Insecurity: Not on file  Transportation Needs: Not on file  Physical Activity: Not on file  Stress: Not on file  Social Connections: Not on file  Intimate Partner Violence: Not on file    Family History  Problem Relation Age of Onset   Cancer Mother    Heart disease Father     Allergies  Allergen Reactions   Acetaminophen Hives     Review of Systems  All other systems reviewed and are negative.      Objective:   BP (!) 140/70   Pulse 95   Ht 5\' 2"  (1.575 m)   Wt 160 lb 9.6 oz (72.8 kg)   SpO2 98%   BMI 29.37 kg/m   Vitals:   02/25/23 1435  BP: (!) 140/70  Pulse: 95  Height: 5\' 2"  (1.575 m)  Weight: 160 lb 9.6 oz (72.8 kg)  SpO2: 98%  BMI (Calculated): 29.37    Physical Exam Vitals and nursing note reviewed.  Constitutional:      Appearance: Normal appearance. She is normal weight.  HENT:     Head: Normocephalic and atraumatic.  Eyes:     Extraocular Movements: Extraocular movements intact.     Conjunctiva/sclera: Conjunctivae normal.     Pupils: Pupils are equal, round, and reactive to light.  Cardiovascular:     Rate and Rhythm: Normal rate.  Pulmonary:     Effort: Pulmonary effort is normal.  Musculoskeletal:        General: Normal range of motion.     Cervical back: Normal range of motion.  Neurological:  General: No focal deficit present.     Mental Status: She is alert and oriented to person, place, and time. Mental status is at baseline.  Psychiatric:        Mood and Affect: Mood normal.        Behavior: Behavior normal.        Thought Content: Thought content normal.        Judgment: Judgment normal.      Results for orders placed or performed in visit on 02/25/23  POCT CBG (Fasting - Glucose)  Result Value Ref Range   Glucose Fasting, POC 252 (A) 70 - 99 mg/dL    Recent Results (from the past 2160 hour(s))  POCT CBG (Fasting - Glucose)     Status: Abnormal   Collection Time: 02/25/23  2:37 PM  Result Value Ref Range   Glucose Fasting, POC 252 (A) 70 - 99 mg/dL       Assessment & Plan:   Problem List Items Addressed This Visit       Active Problems   Essential hypertension, benign    Blood pressure well controlled with current medications.  Continue current therapy.  Will reassess at follow up.       Mixed hyperlipidemia    Continue current therapy  for lipid control. Will modify as needed based on labwork results.        Type 2 diabetes mellitus with hyperglycemia (HCC) - Primary    Continue current diabetes POC, as patient has been well controlled on current regimen.  Will adjust meds if needed based on labs.       Relevant Orders   POCT CBG (Fasting - Glucose) (Completed)    Return in about 1 month (around 03/28/2023) for F/U.   Total time spent: 20 minutes  Miki Kins, FNP  02/25/2023   This document may have been prepared by [facility] Voice Recognition software and as such may include unintentional dictation errors.

## 2023-03-28 ENCOUNTER — Ambulatory Visit: Payer: Medicaid Other | Admitting: Family

## 2023-04-04 ENCOUNTER — Encounter: Payer: Self-pay | Admitting: Family

## 2023-04-04 ENCOUNTER — Ambulatory Visit: Payer: Medicaid Other | Admitting: Family

## 2023-04-04 VITALS — BP 152/90 | HR 97 | Ht 62.0 in | Wt 161.2 lb

## 2023-04-04 DIAGNOSIS — E782 Mixed hyperlipidemia: Secondary | ICD-10-CM

## 2023-04-04 DIAGNOSIS — I10 Essential (primary) hypertension: Secondary | ICD-10-CM

## 2023-04-04 DIAGNOSIS — J301 Allergic rhinitis due to pollen: Secondary | ICD-10-CM

## 2023-04-04 DIAGNOSIS — E559 Vitamin D deficiency, unspecified: Secondary | ICD-10-CM

## 2023-04-04 DIAGNOSIS — E1165 Type 2 diabetes mellitus with hyperglycemia: Secondary | ICD-10-CM

## 2023-04-04 DIAGNOSIS — R5383 Other fatigue: Secondary | ICD-10-CM

## 2023-04-04 DIAGNOSIS — E538 Deficiency of other specified B group vitamins: Secondary | ICD-10-CM

## 2023-04-04 LAB — POCT CBG (FASTING - GLUCOSE)-MANUAL ENTRY: Glucose Fasting, POC: 158 mg/dL — AB (ref 70–99)

## 2023-04-05 LAB — LIPID PANEL
Chol/HDL Ratio: 7.6 ratio — ABNORMAL HIGH (ref 0.0–4.4)
Cholesterol, Total: 189 mg/dL (ref 100–199)
HDL: 25 mg/dL — ABNORMAL LOW (ref >39)
LDL Chol Calc (NIH): 91 mg/dL (ref 0–99)
Triglycerides: 443 mg/dL — ABNORMAL HIGH (ref 0–149)
VLDL Cholesterol Cal: 73 mg/dL — ABNORMAL HIGH (ref 5–40)

## 2023-04-05 LAB — CMP14+EGFR
ALT: 12 IU/L (ref 0–32)
AST: 12 IU/L (ref 0–40)
Albumin: 4.3 g/dL (ref 3.9–4.9)
Alkaline Phosphatase: 96 IU/L (ref 44–121)
BUN/Creatinine Ratio: 7 — ABNORMAL LOW (ref 9–23)
BUN: 10 mg/dL (ref 6–24)
Bilirubin Total: 0.3 mg/dL (ref 0.0–1.2)
CO2: 23 mmol/L (ref 20–29)
Calcium: 9.7 mg/dL (ref 8.7–10.2)
Chloride: 99 mmol/L (ref 96–106)
Creatinine, Ser: 1.44 mg/dL — ABNORMAL HIGH (ref 0.57–1.00)
Globulin, Total: 3.1 g/dL (ref 1.5–4.5)
Glucose: 132 mg/dL — ABNORMAL HIGH (ref 70–99)
Potassium: 5 mmol/L (ref 3.5–5.2)
Sodium: 138 mmol/L (ref 134–144)
Total Protein: 7.4 g/dL (ref 6.0–8.5)
eGFR: 45 mL/min/{1.73_m2} — ABNORMAL LOW (ref >59)

## 2023-04-05 LAB — TSH: TSH: 0.529 u[IU]/mL (ref 0.450–4.500)

## 2023-04-05 LAB — VITAMIN B12: Vitamin B-12: 373 pg/mL (ref 232–1245)

## 2023-04-05 LAB — HEMOGLOBIN A1C
Est. average glucose Bld gHb Est-mCnc: 183 mg/dL
Hgb A1c MFr Bld: 8 % — ABNORMAL HIGH (ref 4.8–5.6)

## 2023-04-05 LAB — VITAMIN D 25 HYDROXY (VIT D DEFICIENCY, FRACTURES): Vit D, 25-Hydroxy: 27.9 ng/mL — ABNORMAL LOW (ref 30.0–100.0)

## 2023-05-01 ENCOUNTER — Encounter: Payer: Self-pay | Admitting: Family

## 2023-05-01 NOTE — Assessment & Plan Note (Signed)
Blood pressure is improved today, though still high.  She says that it has continued to be in this range at home as well.   Continue current meds Will have her take BP at home, bring numbers with her to appt.

## 2023-05-01 NOTE — Assessment & Plan Note (Signed)
Suggested patient use allergy medications and sinus rinse.  I think this will help her significantly.   Will recheck at follow up.

## 2023-05-01 NOTE — Assessment & Plan Note (Signed)
Checking labs today.  Will continue supplements as needed.  

## 2023-05-01 NOTE — Assessment & Plan Note (Signed)
Checking labs today.  Continue current therapy for lipid control. Will modify as needed based on labwork results.  

## 2023-05-01 NOTE — Progress Notes (Signed)
Established Patient Office Visit  Subjective:  Patient ID: Audrey Berry, female    DOB: 1976/04/26  Age: 47 y.o. MRN: [id number]  Chief Complaint  Patient presents with   Follow-up    Patient is here today for her 1 month follow up.  She has been feeling fairly well since last appointment.   She does have additional concerns to discuss today.  Blood pressure still elevated today, says that she has been taking it at home, and it has been around the same as it is today.  She also has been having sinus congestion and stuffiness, as well as itchy eyes and runny nose.   Labs are due today. She needs refills.   I have reviewed her active problem list, medication list, allergies, notes from last encounter, lab results for her appointment today.      No other concerns at this time.   Past Medical History:  Diagnosis Date   Gestational diabetes    Polycystic disease, ovaries     Past Surgical History:  Procedure Laterality Date   CESAREAN SECTION  2001   at [facility]   CESAREAN SECTION  2013   [facility]   HEMORROIDECTOMY  2014   TUBAL LIGATION  2013   [facility]    Social History   Socioeconomic History   Marital status: Married    Spouse name: Not on file   Number of children: Not on file   Years of education: Not on file   Highest education level: Not on file  Occupational History   Not on file  Tobacco Use   Smoking status: Every Day    Current packs/day: 1.00    Average packs/day: 1 pack/day for 15.0 years (15.0 ttl pk-yrs)    Types: Cigarettes   Smokeless tobacco: Never  Substance and Sexual Activity   Alcohol use: Not Currently   Drug use: Not Currently   Sexual activity: Yes    Partners: Male    Birth control/protection: Surgical    Comment: s/p tubal ligation, 2013  Other Topics Concern   Not on file  Social History Narrative   Not on file   Social Determinants of Health   Financial Resource Strain: Not on file  Food Insecurity: Not on file   Transportation Needs: Not on file  Physical Activity: Not on file  Stress: Not on file  Social Connections: Not on file  Intimate Partner Violence: Not on file    Family History  Problem Relation Age of Onset   Cancer Mother    Heart disease Father     Allergies  Allergen Reactions   Acetaminophen Hives    Review of Systems  HENT:  Positive for congestion and sinus pain.   All other systems reviewed and are negative.      Objective:   BP (!) 152/90   Pulse 97   Ht 5\' 2"  (1.575 m)   Wt 161 lb 3.2 oz (73.1 kg)   SpO2 98%   BMI 29.48 kg/m   Vitals:   04/04/23 1427  BP: (!) 152/90  Pulse: 97  Height: 5\' 2"  (1.575 m)  Weight: 161 lb 3.2 oz (73.1 kg)  SpO2: 98%  BMI (Calculated): 29.48    Physical Exam Vitals and nursing note reviewed.  Constitutional:      Appearance: Normal appearance. She is normal weight.  HENT:     Head: Normocephalic.     Nose: Congestion and rhinorrhea present.  Eyes:     Extraocular Movements: Extraocular  movements intact.     Conjunctiva/sclera: Conjunctivae normal.     Pupils: Pupils are equal, round, and reactive to light.  Cardiovascular:     Rate and Rhythm: Normal rate.  Pulmonary:     Effort: Pulmonary effort is normal.  Neurological:     General: No focal deficit present.     Mental Status: She is alert and oriented to person, place, and time. Mental status is at baseline.  Psychiatric:        Mood and Affect: Mood normal.        Behavior: Behavior normal.        Thought Content: Thought content normal.        Judgment: Judgment normal.      Results for orders placed or performed in visit on 04/04/23  Lipid panel  Result Value Ref Range   Cholesterol, Total 189 100 - 199 mg/dL   Triglycerides 102 (H) 0 - 149 mg/dL   HDL 25 (L) >72 mg/dL   VLDL Cholesterol Cal 73 (H) 5 - 40 mg/dL   LDL Chol Calc (NIH) 91 0 - 99 mg/dL   Chol/HDL Ratio 7.6 (H) 0.0 - 4.4 ratio  VITAMIN D 25 Hydroxy (Vit-D Deficiency, Fractures)   Result Value Ref Range   Vit D, 25-Hydroxy 27.9 (L) 30.0 - 100.0 ng/mL  CMP14+EGFR  Result Value Ref Range   Glucose 132 (H) 70 - 99 mg/dL   BUN 10 6 - 24 mg/dL   Creatinine, Ser 5.36 (H) 0.57 - 1.00 mg/dL   eGFR 45 (L) >64 QI/HKV/4.25   BUN/Creatinine Ratio 7 (L) 9 - 23   Sodium 138 134 - 144 mmol/L   Potassium 5.0 3.5 - 5.2 mmol/L   Chloride 99 96 - 106 mmol/L   CO2 23 20 - 29 mmol/L   Calcium 9.7 8.7 - 10.2 mg/dL   Total Protein 7.4 6.0 - 8.5 g/dL   Albumin 4.3 3.9 - 4.9 g/dL   Globulin, Total 3.1 1.5 - 4.5 g/dL   Bilirubin Total 0.3 0.0 - 1.2 mg/dL   Alkaline Phosphatase 96 44 - 121 IU/L   AST 12 0 - 40 IU/L   ALT 12 0 - 32 IU/L  TSH  Result Value Ref Range   TSH 0.529 0.450 - 4.500 uIU/mL  Hemoglobin A1c  Result Value Ref Range   Hgb A1c MFr Bld 8.0 (H) 4.8 - 5.6 %   Est. average glucose Bld gHb Est-mCnc 183 mg/dL  Vitamin Z56  Result Value Ref Range   Vitamin B-12 373 232 - 1,245 pg/mL  POCT CBG (Fasting - Glucose)  Result Value Ref Range   Glucose Fasting, POC 158 (A) 70 - 99 mg/dL    Recent Results (from the past 2160 hour(s))  POCT CBG (Fasting - Glucose)     Status: Abnormal   Collection Time: 02/25/23  2:37 PM  Result Value Ref Range   Glucose Fasting, POC 252 (A) 70 - 99 mg/dL  POCT CBG (Fasting - Glucose)     Status: Abnormal   Collection Time: 04/04/23  2:30 PM  Result Value Ref Range   Glucose Fasting, POC 158 (A) 70 - 99 mg/dL  Lipid panel     Status: Abnormal   Collection Time: 04/04/23  3:02 PM  Result Value Ref Range   Cholesterol, Total 189 100 - 199 mg/dL   Triglycerides 387 (H) 0 - 149 mg/dL   HDL 25 (L) >56 mg/dL   VLDL Cholesterol Cal 73 (H) 5 - 40  mg/dL   LDL Chol Calc (NIH) 91 0 - 99 mg/dL   Chol/HDL Ratio 7.6 (H) 0.0 - 4.4 ratio    Comment:                                   T. Chol/HDL Ratio                                             Men  Women                               1/2 Avg.Risk  3.4    3.3                                    Avg.Risk  5.0    4.4                                2X Avg.Risk  9.6    7.1                                3X Avg.Risk 23.4   11.0   VITAMIN D 25 Hydroxy (Vit-D Deficiency, Fractures)     Status: Abnormal   Collection Time: 04/04/23  3:02 PM  Result Value Ref Range   Vit D, 25-Hydroxy 27.9 (L) 30.0 - 100.0 ng/mL    Comment: Vitamin D deficiency has been defined by the Institute of Medicine and an Endocrine Society practice guideline as a level of serum 25-OH vitamin D less than 20 ng/mL (1,2). The Endocrine Society went on to further define vitamin D insufficiency as a level between 21 and 29 ng/mL (2). 1. IOM (Institute of Medicine). 2010. Dietary reference    intakes for calcium and D. [place]: The    [employer]. 2. Holick MF, Binkley Crabtree, Bischoff-Ferrari HA, et al.    Evaluation, treatment, and prevention of vitamin D    deficiency: an Endocrine Society clinical practice    guideline. JCEM. 2011 Jul; 96(7):1911-30.   CMP14+EGFR     Status: Abnormal   Collection Time: 04/04/23  3:02 PM  Result Value Ref Range   Glucose 132 (H) 70 - 99 mg/dL   BUN 10 6 - 24 mg/dL   Creatinine, Ser 5.78 (H) 0.57 - 1.00 mg/dL   eGFR 45 (L) >46 NG/EXB/2.84   BUN/Creatinine Ratio 7 (L) 9 - 23   Sodium 138 134 - 144 mmol/L   Potassium 5.0 3.5 - 5.2 mmol/L   Chloride 99 96 - 106 mmol/L   CO2 23 20 - 29 mmol/L   Calcium 9.7 8.7 - 10.2 mg/dL   Total Protein 7.4 6.0 - 8.5 g/dL   Albumin 4.3 3.9 - 4.9 g/dL   Globulin, Total 3.1 1.5 - 4.5 g/dL   Bilirubin Total 0.3 0.0 - 1.2 mg/dL   Alkaline Phosphatase 96 44 - 121 IU/L   AST 12 0 - 40 IU/L   ALT 12 0 - 32 IU/L  TSH     Status: None   Collection Time: 04/04/23  3:02  PM  Result Value Ref Range   TSH 0.529 0.450 - 4.500 uIU/mL  Hemoglobin A1c     Status: Abnormal   Collection Time: 04/04/23  3:02 PM  Result Value Ref Range   Hgb A1c MFr Bld 8.0 (H) 4.8 - 5.6 %    Comment:          Prediabetes: 5.7 - 6.4          Diabetes:  >6.4          Glycemic control for adults with diabetes: <7.0    Est. average glucose Bld gHb Est-mCnc 183 mg/dL  Vitamin X91     Status: None   Collection Time: 04/04/23  3:02 PM  Result Value Ref Range   Vitamin B-12 373 232 - 1,245 pg/mL       Assessment & Plan:   Problem List Items Addressed This Visit       Active Problems   Essential hypertension, benign    Blood pressure is improved today, though still high.  She says that it has continued to be in this range at home as well.   Continue current meds Will have her take BP at home, bring numbers with her to appt.       Relevant Orders   CMP14+EGFR (Completed)   CBC with Differential/Platelet   Mixed hyperlipidemia - Primary    Checking labs today.  Continue current therapy for lipid control. Will modify as needed based on labwork results.       Relevant Orders   Lipid panel (Completed)   CMP14+EGFR (Completed)   CBC with Differential/Platelet   Vitamin D deficiency, unspecified    Checking labs today.  Will continue supplements as needed.       Relevant Orders   VITAMIN D 25 Hydroxy (Vit-D Deficiency, Fractures) (Completed)   CMP14+EGFR (Completed)   CBC with Differential/Platelet   Seasonal allergic rhinitis due to pollen    Suggested patient use allergy medications and sinus rinse.  I think this will help her significantly.   Will recheck at follow up.         Relevant Orders   CMP14+EGFR (Completed)   CBC with Differential/Platelet   Type 2 diabetes mellitus with hyperglycemia (HCC)    Checking labs today. Will call pt. With results  Continue current diabetes POC, as patient has been well controlled on current regimen.  Will adjust meds if needed based on labs.       Relevant Orders   POCT CBG (Fasting - Glucose) (Completed)   CMP14+EGFR (Completed)   Hemoglobin A1c (Completed)   CBC with Differential/Platelet   Other Visit Diagnoses     B12 deficiency due to diet       Checking labs  today.  Will continue supplements as needed.   Relevant Orders   CMP14+EGFR (Completed)   Vitamin B12 (Completed)   CBC with Differential/Platelet   Other fatigue       Relevant Orders   CMP14+EGFR (Completed)   TSH (Completed)   CBC with Differential/Platelet       Return in about 1 month (around 05/04/2023) for F/U.   Total time spent: 20 minutes  Miki Kins, FNP  04/04/2023   This document may have been prepared by [facility] Voice Recognition software and as such may include unintentional dictation errors.

## 2023-05-01 NOTE — Assessment & Plan Note (Signed)
Checking labs today. Will call pt. With results  Continue current diabetes POC, as patient has been well controlled on current regimen.  Will adjust meds if needed based on labs.  

## 2023-05-09 ENCOUNTER — Ambulatory Visit: Payer: Medicaid Other | Admitting: Family

## 2024-05-11 LAB — COLOGUARD: COLOGUARD: POSITIVE — AB

## 2024-05-14 ENCOUNTER — Encounter: Payer: Self-pay | Admitting: Family
